# Patient Record
Sex: Female | Born: 1965 | Race: Black or African American | Hispanic: No | Marital: Married | Smoking: Former smoker
Health system: Southern US, Community
[De-identification: ages and names within clinical notes are randomized; demographics above are authoritative.]

## PROBLEM LIST (undated history)

## (undated) DIAGNOSIS — E669 Obesity, unspecified: Secondary | ICD-10-CM

## (undated) DIAGNOSIS — K621 Rectal polyp: Secondary | ICD-10-CM

## (undated) DIAGNOSIS — R7303 Prediabetes: Secondary | ICD-10-CM

## (undated) DIAGNOSIS — D125 Benign neoplasm of sigmoid colon: Secondary | ICD-10-CM

## (undated) DIAGNOSIS — D124 Benign neoplasm of descending colon: Secondary | ICD-10-CM

## (undated) DIAGNOSIS — D649 Anemia, unspecified: Secondary | ICD-10-CM

## (undated) HISTORY — DX: Benign neoplasm of descending colon: D12.4

## (undated) HISTORY — PX: NO PAST SURGERIES: SHX2092

## (undated) HISTORY — DX: Benign neoplasm of sigmoid colon: D12.5

## (undated) HISTORY — DX: Rectal polyp: K62.1

---

## 2004-09-19 ENCOUNTER — Emergency Department (HOSPITAL_COMMUNITY): Admission: EM | Admit: 2004-09-19 | Discharge: 2004-09-19 | Payer: Self-pay | Admitting: *Deleted

## 2011-03-26 ENCOUNTER — Ambulatory Visit: Payer: Self-pay | Admitting: Internal Medicine

## 2011-08-13 ENCOUNTER — Ambulatory Visit: Payer: Self-pay | Admitting: Family Medicine

## 2013-07-04 ENCOUNTER — Ambulatory Visit: Payer: Self-pay | Admitting: Adult Health

## 2013-10-01 IMAGING — US TRANSABDOMINAL ULTRASOUND OF PELVIS
1 series · 17 of 25 positions shown · non-contrast
Comparison: none

REASON FOR EXAM: Enlarged  Uterus Eval Fibroids
COMMENTS:

PROCEDURE:     ARMINAS - ARMINAS PELVIS NON-OB  - August 13, 2011  [DATE]
RESULT:     Comparison: None.
TECHNIQUE: Multiple grayscale and color Doppler images were obtained of the
pelvis via transabdominal ultrasound. Endovaginal ultrasound was not
performed.

[Series 1: transabdominal ultrasound of pelvis · 17 of 64 slices shown]
[im 1/64]
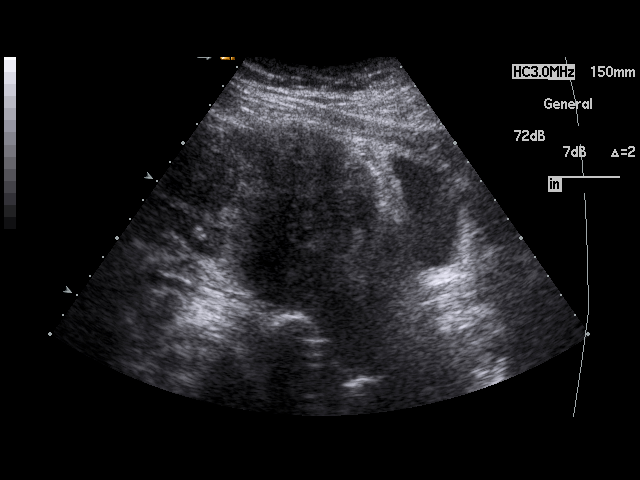
[im 6/64]
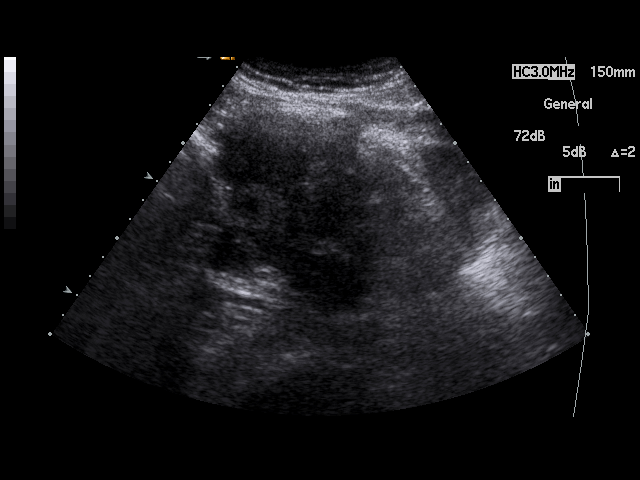
[im 8/64]
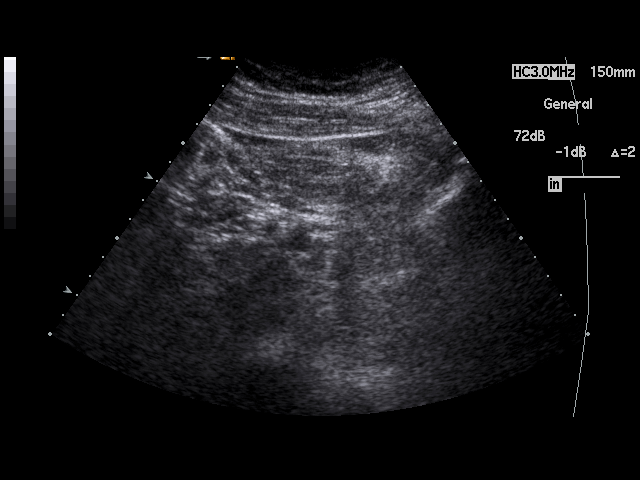
[im 14/64]
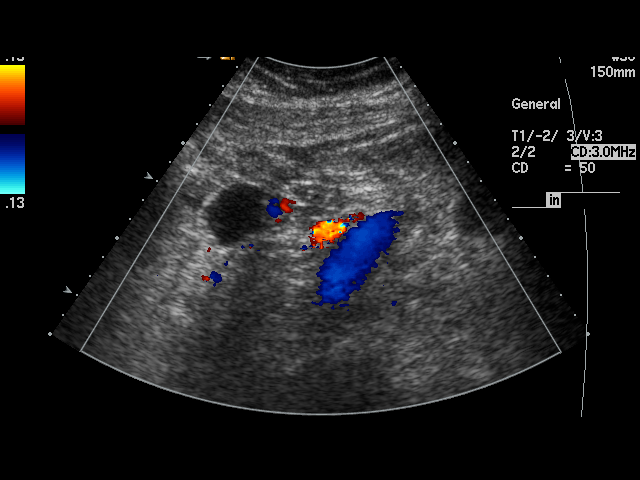
[im 16/64]
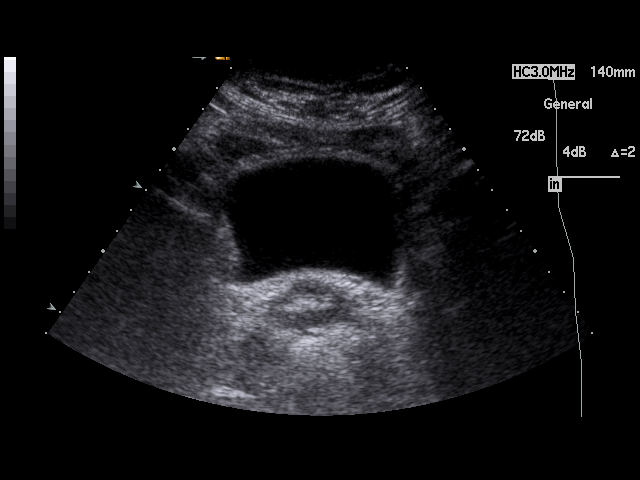
[im 22/64]
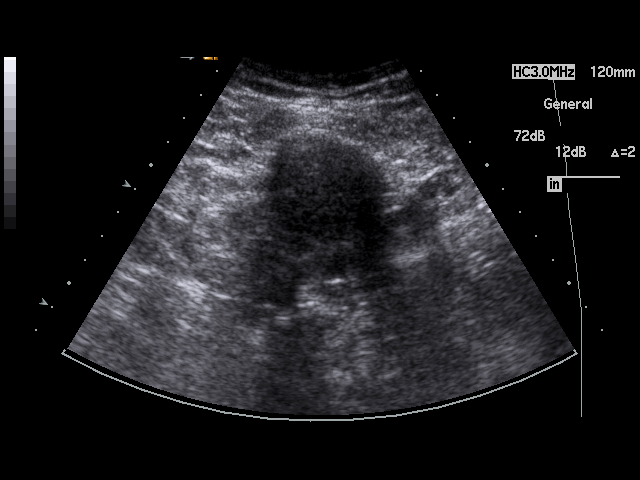
[im 24/64]
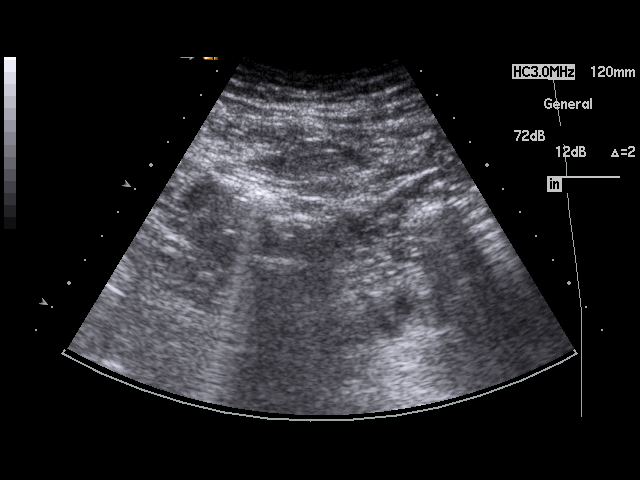
[im 29/64]
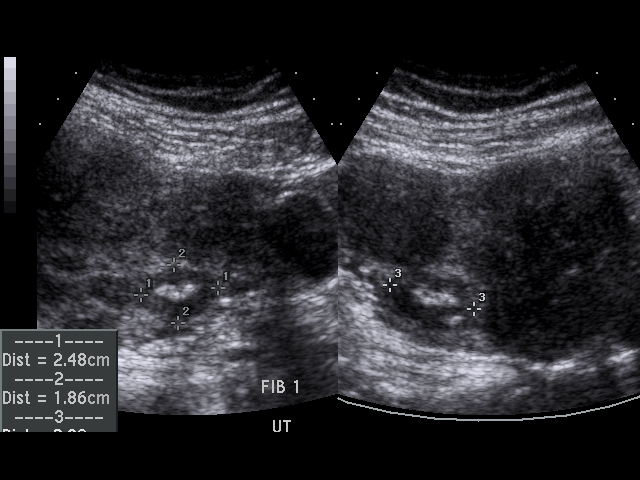
[im 32/64]
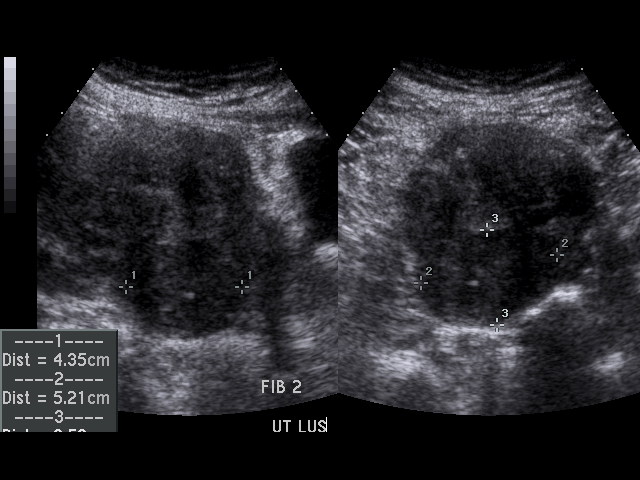
[im 35/64]
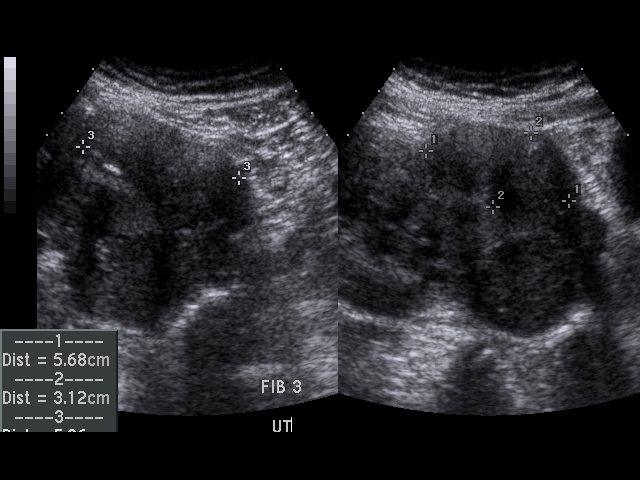
[im 40/64]
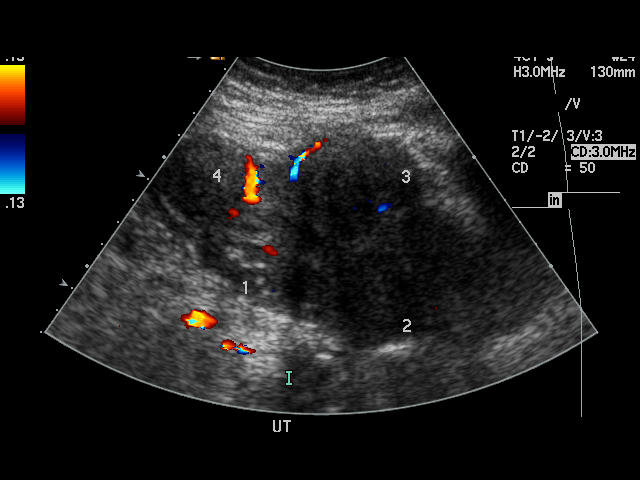
[im 43/64]
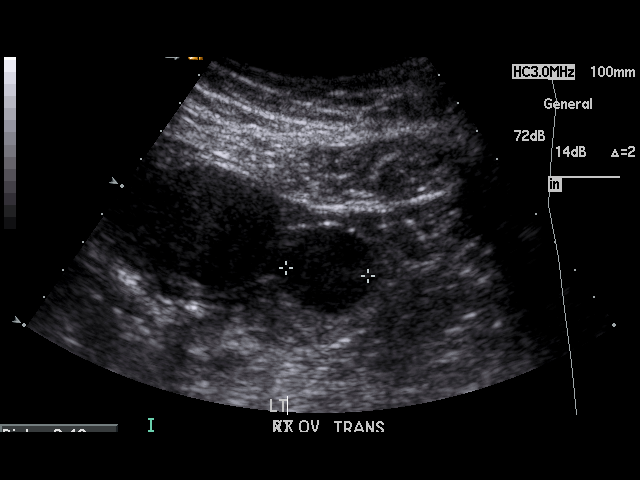
[im 48/64]
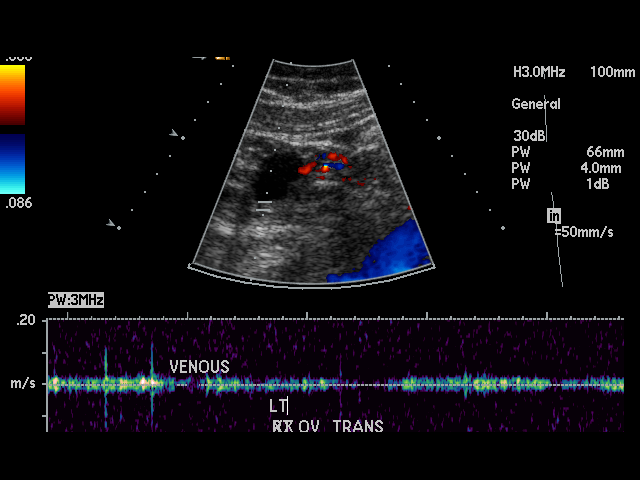
[im 50/64]
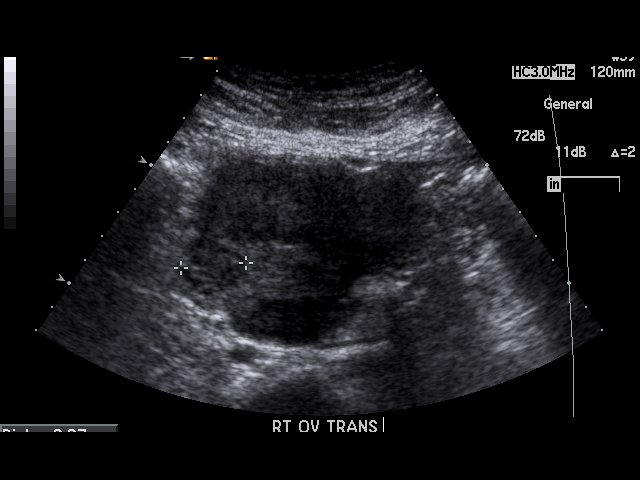
[im 56/64]
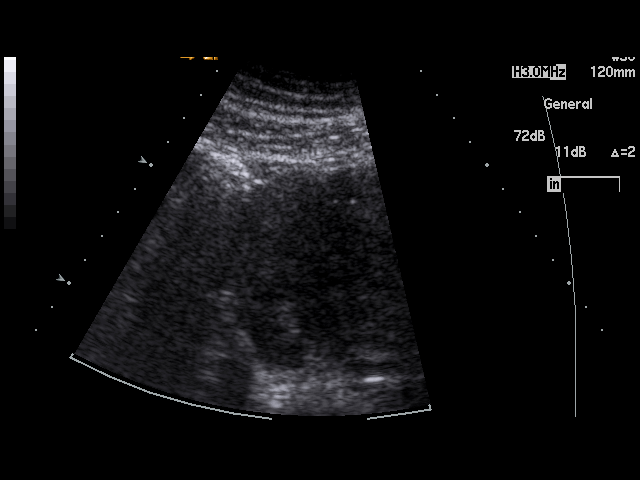
[im 58/64]
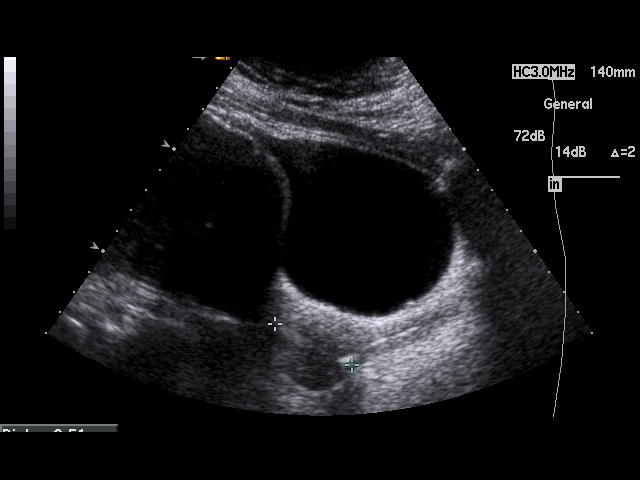
[im 64/64]
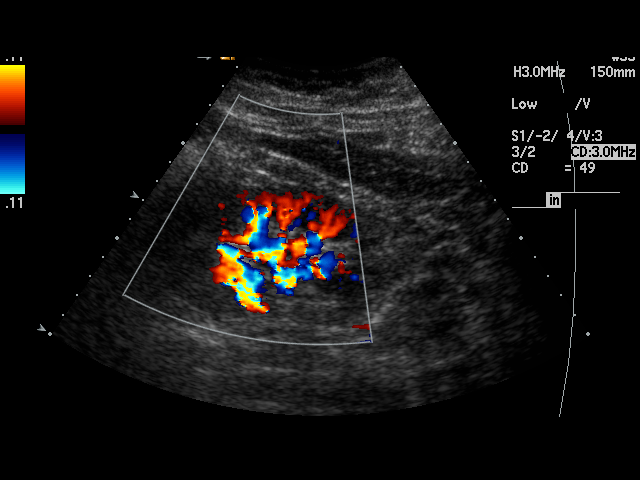

[17 of 25 positions shown; findings below may reference images not displayed]

FINDINGS: The uterus is enlarged, measuring 16.1 x 8.1 x 10.4 cm. There are
innumerable hypoechoic masses in the uterus, consistent with fibroids. The
largest is seen along the anterior left side of the uterus, measuring 6.0 x
5.7 x 3.1 cm. What is felt to represent the endometrial stripe measures 3 mm.

The right ovary measures 3.2 x 1.7 x 2.3 cm. The left ovary measures 3.5 x
2.6 x 2.7 cm. There is a cyst or dominant follicle in the left ovary. It
measures 2.6 cm in greatest dimension. No adnexal mass identified.

Limited images of the kidneys showed no hydronephrosis.
IMPRESSION: Enlarged, fibroid uterus.

## 2014-05-27 ENCOUNTER — Ambulatory Visit: Payer: Self-pay | Admitting: Family Medicine

## 2014-08-01 LAB — HM PAP SMEAR: HM PAP: NEGATIVE

## 2015-04-16 DIAGNOSIS — R748 Abnormal levels of other serum enzymes: Secondary | ICD-10-CM | POA: Insufficient documentation

## 2015-04-16 DIAGNOSIS — N951 Menopausal and female climacteric states: Secondary | ICD-10-CM | POA: Insufficient documentation

## 2015-04-16 DIAGNOSIS — R7303 Prediabetes: Secondary | ICD-10-CM

## 2015-04-16 DIAGNOSIS — E559 Vitamin D deficiency, unspecified: Secondary | ICD-10-CM

## 2015-04-16 DIAGNOSIS — D219 Benign neoplasm of connective and other soft tissue, unspecified: Secondary | ICD-10-CM | POA: Insufficient documentation

## 2015-04-16 DIAGNOSIS — E7800 Pure hypercholesterolemia, unspecified: Secondary | ICD-10-CM | POA: Insufficient documentation

## 2015-04-16 HISTORY — DX: Abnormal levels of other serum enzymes: R74.8

## 2015-04-16 HISTORY — DX: Vitamin D deficiency, unspecified: E55.9

## 2015-04-16 HISTORY — DX: Hypercalcemia: E83.52

## 2015-04-16 HISTORY — DX: Menopausal and female climacteric states: N95.1

## 2015-04-16 HISTORY — DX: Benign neoplasm of connective and other soft tissue, unspecified: D21.9

## 2015-04-16 HISTORY — DX: Prediabetes: R73.03

## 2015-06-27 ENCOUNTER — Encounter: Payer: Self-pay | Admitting: Physician Assistant

## 2015-06-27 ENCOUNTER — Ambulatory Visit (INDEPENDENT_AMBULATORY_CARE_PROVIDER_SITE_OTHER): Payer: BLUE CROSS/BLUE SHIELD | Admitting: Physician Assistant

## 2015-06-27 VITALS — BP 104/80 | HR 89 | Temp 97.5°F | Resp 16 | Wt 207.6 lb

## 2015-06-27 DIAGNOSIS — R7303 Prediabetes: Secondary | ICD-10-CM

## 2015-06-27 DIAGNOSIS — Z1211 Encounter for screening for malignant neoplasm of colon: Secondary | ICD-10-CM

## 2015-06-27 DIAGNOSIS — E7800 Pure hypercholesterolemia, unspecified: Secondary | ICD-10-CM

## 2015-06-27 DIAGNOSIS — R5383 Other fatigue: Secondary | ICD-10-CM | POA: Diagnosis not present

## 2015-06-27 DIAGNOSIS — R748 Abnormal levels of other serum enzymes: Secondary | ICD-10-CM

## 2015-06-27 DIAGNOSIS — R7309 Other abnormal glucose: Secondary | ICD-10-CM

## 2015-06-27 DIAGNOSIS — E559 Vitamin D deficiency, unspecified: Secondary | ICD-10-CM | POA: Diagnosis not present

## 2015-06-27 NOTE — Progress Notes (Signed)
Patient ID: Annette Bradford, female   DOB: 1966/06/07, 49 y.o.   MRN: [id number]  Name: TABATHIA KNOCHE   MRN: [id number]    DOB: 1966-10-09   Date:06/27/2015       Progress Note  Subjective  Chief Complaint  Chief Complaint  Patient presents with  . Follow-up    patient requesting labs and referral for a colonoscopy    HPI  Magaby Rumberger is a 49 year old female that returns to the office today for follow-up on her elevated hemoglobin A1c, high cholesterol, abnormal liver enzymes, and vitamin D deficiency. She is also interested in going ahead and getting a referral for her screening colonoscopy since she is now 49 years old. She has no complaints today and states that she feels well. No problem-specific assessment & plan notes found for this encounter.   History reviewed. No pertinent past medical history.  Past Surgical History  Procedure Laterality Date  . No past surgeries      Family History  Problem Relation Age of Onset  . Diabetes Mother   . Heart attack Father     Social History   Social History  . Marital Status: Married    Spouse Name: N/A  . Number of Children: N/A  . Years of Education: N/A   Occupational History  . Not on file.   Social History Main Topics  . Smoking status: Former Research scientist (life sciences)  . Smokeless tobacco: Not on file  . Alcohol Use: 0.0 oz/week    0 Standard drinks or equivalent per week     Comment: OCCASIONALLY 1-2 TIMES A MONTH  . Drug Use: No  . Sexual Activity: Not on file   Other Topics Concern  . Not on file   Social History Narrative     Current outpatient prescriptions:  Marland Kitchen  Glucosamine-Chondroitin (OSTEO BI-FLEX REGULAR STRENGTH PO), Take by mouth., Disp: , Rfl:  .  MULTIPLE VITAMINS-MINERALS PO, Take 1 tablet by mouth daily., Disp: , Rfl:  .  OMEGA-3 FATTY ACIDS PO, Take 2 capsules by mouth daily., Disp: , Rfl:  .  Psyllium (METAMUCIL MULTIHEALTH FIBER) 55.46 % POWD, Take by mouth., Disp: , Rfl:   No Known  Allergies   Review of Systems  Constitutional: Negative.   HENT: Negative.   Eyes: Negative.   Respiratory: Negative.   Cardiovascular: Negative.   Gastrointestinal: Negative.   Genitourinary: Negative.   Musculoskeletal: Negative.   Skin: Negative.   Neurological: Negative.   Endo/Heme/Allergies: Negative.   Psychiatric/Behavioral: Negative.     Objective  Filed Vitals:   06/27/15 0937  BP: 104/80  Pulse: 89  Temp: 97.5 F (36.4 C)  TempSrc: Oral  Resp: 16  Weight: 207 lb 9.6 oz (94.167 kg)  SpO2: 98%    Physical Exam  Constitutional: She is oriented to person, place, and time and well-developed, well-nourished, and in no distress. No distress.  HENT:  Head: Normocephalic and atraumatic.  Right Ear: Hearing, tympanic membrane, external ear and ear canal normal.  Left Ear: Hearing, tympanic membrane, external ear and ear canal normal.  Nose: Nose normal.  Mouth/Throat: Uvula is midline, oropharynx is clear and moist and mucous membranes are normal. No oropharyngeal exudate.  Eyes: Conjunctivae and EOM are normal. Pupils are equal, round, and reactive to light. Right eye exhibits no discharge. Left eye exhibits no discharge. No scleral icterus.  Neck: Normal range of motion. Neck supple. No tracheal deviation present. No thyromegaly present.  Cardiovascular: Normal rate, regular rhythm and normal heart sounds.  Exam reveals no gallop and no friction rub.   No murmur heard. Pulmonary/Chest: Effort normal and breath sounds normal. No respiratory distress. She has no wheezes. She has no rales.  Abdominal: Soft. Bowel sounds are normal. She exhibits no distension and no mass. There is no tenderness. There is no rebound and no guarding.  Lymphadenopathy:    She has no cervical adenopathy.  Neurological: She is alert and oriented to person, place, and time. Gait normal.  Skin: Skin is warm and dry. She is not diaphoretic.  Psychiatric: Mood, memory, affect and judgment  normal.  Vitals reviewed.  No results found for this or any previous visit (from the past 2160 hour(s)).   Assessment & Plan  Problem List Items Addressed This Visit      Endocrine   Borderline diabetes - Primary   Relevant Orders   HgB A1c     Other   Abnormal liver enzymes   Relevant Orders   Comprehensive Metabolic Panel (CMET)   Hypercholesteremia   Relevant Orders   Lipid panel   Avitaminosis D   Relevant Orders   Vitamin D (25 hydroxy)   Calcium blood increased   Relevant Orders   Comprehensive Metabolic Panel (CMET)    Other Visit Diagnoses    Colon cancer screening        Relevant Orders    CBC with Differential    Ambulatory referral to Gastroenterology    Other fatigue        Relevant Orders    TSH      I will check labs and follow-up pending labs. I also placed a referral to GI for her screening colonoscopy. Follow-up will be set per the lab results. She is going to have her labs drawn at work since they can be done for free and these results will be faxed to [place] once they are received.  Meds ordered this encounter  Medications  . Glucosamine-Chondroitin (OSTEO BI-FLEX REGULAR STRENGTH PO)    Sig: Take by mouth.

## 2015-07-01 ENCOUNTER — Telehealth: Payer: Self-pay | Admitting: Gastroenterology

## 2015-07-01 ENCOUNTER — Other Ambulatory Visit: Payer: Self-pay

## 2015-07-01 NOTE — Telephone Encounter (Signed)
Gastroenterology Pre-Procedure Review  Request Date: 08-15-2015 Requesting Physician: Dr.   PATIENT REVIEW QUESTIONS: The patient responded to the following health history questions as indicated:    1. Are you having any GI issues? no 2. Do you have a personal history of Polyps? no 3. Do you have a family history of Colon Cancer or Polyps? no 4. Diabetes Mellitus? no 5. Joint replacements in the past 12 months?no 6. Major health problems in the past 3 months?no 7. Any artificial heart valves, MVP, or defibrillator?no    MEDICATIONS & ALLERGIES:    Patient reports the following regarding taking any anticoagulation/antiplatelet therapy:   Plavix, Coumadin, Eliquis, Xarelto, Lovenox, Pradaxa, Brilinta, or Effient? no Aspirin? no  Patient confirms/reports the following medications:  Current Outpatient Prescriptions  Medication Sig Dispense Refill   Glucosamine-Chondroitin (OSTEO BI-FLEX REGULAR STRENGTH PO) Take by mouth.     MULTIPLE VITAMINS-MINERALS PO Take 1 tablet by mouth daily.     OMEGA-3 FATTY ACIDS PO Take 2 capsules by mouth daily.     Psyllium (METAMUCIL MULTIHEALTH FIBER) 55.46 % POWD Take by mouth.     No current facility-administered medications for this visit.    Patient confirms/reports the following allergies:  No Known Allergies  No orders of the defined types were placed in this encounter.    AUTHORIZATION INFORMATION Primary Insurance: 1D#: Group #:  Secondary Insurance: 1D#: Group #:  SCHEDULE INFORMATION: Date: 08-15-2015 Time: Location:[facility]

## 2015-07-01 NOTE — Telephone Encounter (Signed)
Orders sent to [facility] for 08-15-15.

## 2015-07-18 ENCOUNTER — Encounter: Payer: Self-pay | Admitting: Physician Assistant

## 2015-07-25 ENCOUNTER — Telehealth: Payer: Self-pay | Admitting: Physician Assistant

## 2015-07-25 NOTE — Telephone Encounter (Signed)
Patient advised as directed below. Will call back to schedule appointment in 6 months.  Thanks,  -Joseline

## 2015-07-25 NOTE — Telephone Encounter (Signed)
Please inform patient I received her lab results.  Blood count and kidney function were normal.  TSH and Vit D were normal also.  There was a slight increase in liver function measurements.  Cholesterol was also elevated as was hemoglobin A1c.  Try to adhere to a low fat, low cholesterol, low sugar diet.  Also try to add exercise for 30-40 minutes 3 times per week.  We will recheck LFTs, cholesterol and HgBA1c in 6 months.  Thanks!

## 2015-11-28 LAB — HM MAMMOGRAPHY

## 2015-12-12 ENCOUNTER — Encounter: Payer: Self-pay | Admitting: Physician Assistant

## 2015-12-16 ENCOUNTER — Encounter: Payer: Self-pay | Admitting: *Deleted

## 2015-12-25 NOTE — Discharge Instructions (Signed)

## 2015-12-26 ENCOUNTER — Encounter
Admission: RE | Disposition: A | Discharge status: Home / Self Care | Payer: Self-pay | Source: Ambulatory Visit | Attending: Gastroenterology

## 2015-12-26 ENCOUNTER — Ambulatory Visit: Payer: BLUE CROSS/BLUE SHIELD | Admitting: Anesthesiology

## 2015-12-26 ENCOUNTER — Ambulatory Visit
Admission: RE | Admit: 2015-12-26 | Discharge: 2015-12-26 | Disposition: A | Discharge status: Home / Self Care | Payer: BLUE CROSS/BLUE SHIELD | Source: Ambulatory Visit | Attending: Gastroenterology | Admitting: Gastroenterology

## 2015-12-26 DIAGNOSIS — K641 Second degree hemorrhoids: Secondary | ICD-10-CM | POA: Diagnosis not present

## 2015-12-26 DIAGNOSIS — Z1211 Encounter for screening for malignant neoplasm of colon: Secondary | ICD-10-CM | POA: Diagnosis present

## 2015-12-26 DIAGNOSIS — D124 Benign neoplasm of descending colon: Secondary | ICD-10-CM | POA: Insufficient documentation

## 2015-12-26 DIAGNOSIS — D125 Benign neoplasm of sigmoid colon: Secondary | ICD-10-CM | POA: Insufficient documentation

## 2015-12-26 DIAGNOSIS — K621 Rectal polyp: Secondary | ICD-10-CM | POA: Insufficient documentation

## 2015-12-26 DIAGNOSIS — Z79899 Other long term (current) drug therapy: Secondary | ICD-10-CM | POA: Diagnosis not present

## 2015-12-26 DIAGNOSIS — E7800 Pure hypercholesterolemia, unspecified: Secondary | ICD-10-CM | POA: Diagnosis not present

## 2015-12-26 DIAGNOSIS — Z87891 Personal history of nicotine dependence: Secondary | ICD-10-CM | POA: Diagnosis not present

## 2015-12-26 HISTORY — PX: POLYPECTOMY: SHX5525

## 2015-12-26 HISTORY — PX: COLONOSCOPY WITH PROPOFOL: SHX5780

## 2015-12-26 SURGERY — COLONOSCOPY WITH PROPOFOL
Anesthesia: Monitor Anesthesia Care

## 2015-12-26 MED ORDER — SODIUM CHLORIDE 0.9 % IV SOLN: INTRAVENOUS | Status: DC

## 2015-12-26 MED ORDER — STERILE WATER FOR IRRIGATION IR SOLN
Status: DC | PRN
  Administered 2015-12-26: 08:00:00

## 2015-12-26 MED ORDER — PROPOFOL 10 MG/ML IV BOLUS
INTRAVENOUS | Status: DC | PRN
  Administered 2015-12-26: 150 mg via INTRAVENOUS
  Administered 2015-12-26 (×2): 50 mg via INTRAVENOUS

## 2015-12-26 MED ORDER — LACTATED RINGERS IV SOLN
INTRAVENOUS | Status: DC
  Administered 2015-12-26: 07:00:00 via INTRAVENOUS

## 2015-12-26 MED ORDER — FENTANYL CITRATE (PF) 100 MCG/2ML IJ SOLN: 25.0000 ug | INTRAMUSCULAR | Status: DC | PRN

## 2015-12-26 MED ORDER — LIDOCAINE HCL (CARDIAC) 20 MG/ML IV SOLN
INTRAVENOUS | Status: DC | PRN
  Administered 2015-12-26: 50 mg via INTRAVENOUS

## 2015-12-26 SURGICAL SUPPLY — 28 items
CANISTER SUCT 1200ML W/VALVE (MISCELLANEOUS) ×4 IMPLANT
FCP ESCP3.2XJMB 240X2.8X (MISCELLANEOUS)
FORCEPS BIOP RAD 4 LRG CAP 4 (CUTTING FORCEPS) IMPLANT
FORCEPS BIOP RJ4 240 W/NDL (MISCELLANEOUS)
FORCEPS ESCP3.2XJMB 240X2.8X (MISCELLANEOUS) IMPLANT
GOWN CVR UNV OPN BCK APRN NK (MISCELLANEOUS) ×4 IMPLANT
GOWN ISOL THUMB LOOP REG UNIV (MISCELLANEOUS) ×8
HEMOCLIP INSTINCT (CLIP) IMPLANT
INJECTOR VARIJECT VIN23 (MISCELLANEOUS) IMPLANT
KIT CO2 TUBING (TUBING) IMPLANT
KIT DEFENDO VALVE AND CONN (KITS) IMPLANT
KIT ENDO PROCEDURE OLY (KITS) ×4 IMPLANT
LIGATOR MULTIBAND 6SHOOTER MBL (MISCELLANEOUS) IMPLANT
MARKER SPOT ENDO TATTOO 5ML (MISCELLANEOUS) IMPLANT
PAD GROUND ADULT SPLIT (MISCELLANEOUS) IMPLANT
SNARE SHORT THROW 13M SML OVAL (MISCELLANEOUS) ×2 IMPLANT
SNARE SHORT THROW 30M LRG OVAL (MISCELLANEOUS) IMPLANT
SPOT EX ENDOSCOPIC TATTOO (MISCELLANEOUS)
SUCTION POLY TRAP 4CHAMBER (MISCELLANEOUS) IMPLANT
TRAP SUCTION POLY (MISCELLANEOUS) IMPLANT
TUBING CONN 6MMX3.1M (TUBING)
TUBING SUCTION CONN 0.25 STRL (TUBING) IMPLANT
UNDERPAD 30X60 958B10 (PK) (MISCELLANEOUS) IMPLANT
VALVE BIOPSY ENDO (VALVE) IMPLANT
VARIJECT INJECTOR VIN23 (MISCELLANEOUS)
WATER AUXILLARY (MISCELLANEOUS) IMPLANT
WATER STERILE IRR 250ML POUR (IV SOLUTION) ×4 IMPLANT
WATER STERILE IRR 500ML POUR (IV SOLUTION) IMPLANT

## 2015-12-26 NOTE — Anesthesia Postprocedure Evaluation (Signed)
Anesthesia Post Note  Patient: Annette Bradford  Procedure(s) Performed: Procedure(s) (LRB): COLONOSCOPY WITH PROPOFOL (N/A) POLYPECTOMY  Patient location during evaluation: PACU Anesthesia Type: MAC Level of consciousness: awake and alert Pain management: pain level controlled Vital Signs Assessment: post-procedure vital signs reviewed and stable Respiratory status: spontaneous breathing, nonlabored ventilation, respiratory function stable and patient connected to nasal cannula oxygen Cardiovascular status: blood pressure returned to baseline and stable Postop Assessment: no signs of nausea or vomiting Anesthetic complications: no    Saadia Dewitt C

## 2015-12-26 NOTE — Anesthesia Postprocedure Evaluation (Deleted)
Anesthesia Post Note  Patient: Annette Bradford  Procedure(s) Performed: Procedure(s) (LRB): COLONOSCOPY WITH PROPOFOL (N/A) POLYPECTOMY  Patient location during evaluation: PACU Anesthesia Type: General Level of consciousness: awake and alert Pain management: pain level controlled Vital Signs Assessment: post-procedure vital signs reviewed and stable Respiratory status: spontaneous breathing, nonlabored ventilation, respiratory function stable and patient connected to nasal cannula oxygen Cardiovascular status: blood pressure returned to baseline and stable Postop Assessment: no signs of nausea or vomiting Anesthetic complications: no    Dev Dhondt C

## 2015-12-26 NOTE — Anesthesia Procedure Notes (Signed)
Procedure Name: MAC Date/Time: 12/26/2015 7:46 AM Performed by: Cameron Ali Pre-anesthesia Checklist: Patient identified, Emergency Drugs available, Suction available, Timeout performed and Patient being monitored Patient Re-evaluated:Patient Re-evaluated prior to inductionOxygen Delivery Method: Nasal cannula Placement Confirmation: positive ETCO2

## 2015-12-26 NOTE — Addendum Note (Signed)
Addendum  created 12/26/15 1151 by Shyrl Numbers, MD   Modules edited: Clinical Notes, Notes Section   Clinical Notes:  File: [id number]   Notes Section:  Delete: [id number]

## 2015-12-26 NOTE — H&P (Signed)
  [facility]  [address], [place], [place] [address] Phone: [phone] Fax : [phone]  Primary Care Physician:  Mar Daring, PA-C Primary Gastroenterologist:  Dr. Allen Norris  Pre-Procedure History & Physical: HPI:  Annette Bradford is a 50 y.o. female is here for a screening colonoscopy.   History reviewed. No pertinent past medical history.  Past Surgical History  Procedure Laterality Date  . No past surgeries      Prior to Admission medications   Medication Sig Start Date End Date Taking? Authorizing Provider  Glucosamine-Chondroitin (OSTEO BI-FLEX REGULAR STRENGTH PO) Take by mouth.   Yes Historical Provider, MD  MULTIPLE VITAMINS-MINERALS PO Take 1 tablet by mouth daily.   Yes Historical Provider, MD  OMEGA-3 FATTY ACIDS PO Take 2 capsules by mouth daily. 08/10/13  Yes Historical Provider, MD  Psyllium (METAMUCIL MULTIHEALTH FIBER) 55.46 % POWD Take by mouth.    Yes Historical Provider, MD    Allergies as of 07/01/2015  . (No Known Allergies)    Family History  Problem Relation Age of Onset  . Diabetes Mother   . Heart attack Father     Social History   Social History  . Marital Status: Married    Spouse Name: N/A  . Number of Children: N/A  . Years of Education: N/A   Occupational History  . Not on file.   Social History Main Topics  . Smoking status: Former Research scientist (life sciences)  . Smokeless tobacco: Not on file  . Alcohol Use: 0.0 oz/week    0 Standard drinks or equivalent per week     Comment: OCCASIONALLY 1-2 TIMES A MONTH  . Drug Use: No  . Sexual Activity: Not on file   Other Topics Concern  . Not on file   Social History Narrative    Review of Systems: See HPI, otherwise negative ROS  Physical Exam: BP 105/78 mmHg  Pulse 88  Temp(Src) 97.9 F (36.6 C) (Temporal)  Resp 18  Ht 5\' 2"  (1.575 m)  Wt 206 lb (93.441 kg)  BMI 37.67 kg/m2  SpO2 97% General:   Alert,  pleasant and cooperative in NAD Head:  Normocephalic  and atraumatic. Neck:  Supple; no masses or thyromegaly. Lungs:  Clear throughout to auscultation.    Heart:  Regular rate and rhythm. Abdomen:  Soft, nontender and nondistended. Normal bowel sounds, without guarding, and without rebound.   Neurologic:  Alert and  oriented x4;  grossly normal neurologically.  Impression/Plan: Annette Bradford is now here to undergo a screening colonoscopy.  Risks, benefits, and alternatives regarding colonoscopy have been reviewed with the patient.  Questions have been answered.  All parties agreeable.

## 2015-12-26 NOTE — Transfer of Care (Signed)
Immediate Anesthesia Transfer of Care Note  Patient: Annette Bradford  Procedure(s) Performed: Procedure(s): COLONOSCOPY WITH PROPOFOL (N/A) POLYPECTOMY  Patient Location: PACU  Anesthesia Type: MAC  Level of Consciousness: awake, alert  and patient cooperative  Airway and Oxygen Therapy: Patient Spontanous Breathing and Patient connected to supplemental oxygen  Post-op Assessment: Post-op Vital signs reviewed, Patient's Cardiovascular Status Stable, Respiratory Function Stable, Patent Airway and No signs of Nausea or vomiting  Post-op Vital Signs: Reviewed and stable  Complications: No apparent anesthesia complications

## 2015-12-26 NOTE — Op Note (Signed)
[facility] Patient Name: Annette Bradford Procedure Date: 12/26/2015 7:48 AM MRN: [id number] Account #: [id number] Date of Birth: [birth year] Admit Type: Outpatient Age: 50 Room: [facility] OR ROOM 01 Gender: Female Note Status: Finalized Procedure:            Colonoscopy Indications:          Screening for colorectal malignant neoplasm Providers:            Lucilla Lame, MD Referring MD:         Stephens Shire (Referring MD) Medicines:            Propofol per Anesthesia Complications:        No immediate complications. Procedure:            Pre-Anesthesia Assessment:                       - Prior to the procedure, a History and Physical was                        performed, and patient medications and allergies were                        reviewed. The patient's tolerance of previous                        anesthesia was also reviewed. The risks and benefits of                        the procedure and the sedation options and risks were                        discussed with the patient. All questions were                        answered, and informed consent was obtained. Prior                        Anticoagulants: The patient has taken no previous                        anticoagulant or antiplatelet agents. ASA Grade                        Assessment: II - A patient with mild systemic disease.                        After reviewing the risks and benefits, the patient was                        deemed in satisfactory condition to undergo the                        procedure.                       After obtaining informed consent, the colonoscope was                        passed under direct vision. Throughout the procedure,  the patient's blood pressure, pulse, and oxygen                        saturations were monitored continuously. The Olympus CF                        H180AL colonoscope (S#: [id number]) was introduced through                         the anus and advanced to the the cecum, identified by                        appendiceal orifice and ileocecal valve. The                        colonoscopy was performed without difficulty. The                        patient tolerated the procedure well. The quality of                        the bowel preparation was excellent. Findings:      The perianal and digital rectal examinations were normal.      A 4 mm polyp was found in the descending colon. The polyp was sessile.       The polyp was removed with a cold biopsy forceps. Resection and       retrieval were complete.      A 8 mm polyp was found in the sigmoid colon. The polyp was sessile. The       polyp was removed with a cold snare. Resection and retrieval were       complete.      A 5 mm polyp was found in the rectum. The polyp was sessile. The polyp       was removed with a cold snare. Resection and retrieval were complete.      Non-bleeding internal hemorrhoids were found during retroflexion. The       hemorrhoids were Grade II (internal hemorrhoids that prolapse but reduce       spontaneously). Impression:           - One 4 mm polyp in the descending colon, removed with                        a cold biopsy forceps. Resected and retrieved.                       - One 8 mm polyp in the sigmoid colon, removed with a                        cold snare. Resected and retrieved.                       - One 5 mm polyp in the rectum, removed with a cold                        snare. Resected and retrieved.                       - Non-bleeding internal hemorrhoids. Recommendation:       -  Await pathology results.                       - Repeat colonoscopy in 5 years if polyp adenoma and 10                        years if hyperplastic Procedure Code(s):    --- Professional ---                       [phone], Colonoscopy, flexible; with removal of tumor(s),                        polyp(s), or other lesion(s) by  snare technique                       45380, 48, Colonoscopy, flexible; with biopsy, single                        or multiple Diagnosis Code(s):    --- Professional ---                       Z12.11, Encounter for screening for malignant neoplasm                        of colon                       D12.4, Benign neoplasm of descending colon                       D12.5, Benign neoplasm of sigmoid colon                       K62.1, Rectal polyp CPT copyright 2016 American Medical Association. All rights reserved. The codes documented in this report are preliminary and upon coder review may  be revised to meet current compliance requirements. Lucilla Lame, MD 12/26/2015 8:07:15 AM This report has been signed electronically. Number of Addenda: 0 Note Initiated On: 12/26/2015 7:48 AM Scope Withdrawal Time: 0 hours 8 minutes 32 seconds  Total Procedure Duration: 0 hours 11 minutes 49 seconds       [facility]

## 2015-12-26 NOTE — Anesthesia Preprocedure Evaluation (Signed)
Anesthesia Evaluation  Patient identified by MRN, date of birth, ID band Patient awake    Reviewed: Allergy & Precautions, NPO status , Patient's Chart, lab work & pertinent test results  Airway Mallampati: II  TM Distance: >3 FB Neck ROM: Full    Dental no notable dental hx.    Pulmonary neg pulmonary ROS, former smoker,    Pulmonary exam normal breath sounds clear to auscultation       Cardiovascular negative cardio ROS Normal cardiovascular exam Rhythm:Regular Rate:Normal  hypercholesterol   Neuro/Psych negative neurological ROS  negative psych ROS   GI/Hepatic negative GI ROS, Neg liver ROS,   Endo/Other  negative endocrine ROSBorderline diabetes  Renal/GU negative Renal ROS  negative genitourinary   Musculoskeletal negative musculoskeletal ROS (+)   Abdominal   Peds negative pediatric ROS (+)  Hematology negative hematology ROS (+)   Anesthesia Other Findings   Reproductive/Obstetrics negative OB ROS                             Anesthesia Physical Anesthesia Plan  ASA: II  Anesthesia Plan: MAC   Post-op Pain Management:    Induction: Intravenous  Airway Management Planned: Natural Airway  Additional Equipment:   Intra-op Plan:   Post-operative Plan: Extubation in OR  Informed Consent: I have reviewed the patients History and Physical, chart, labs and discussed the procedure including the risks, benefits and alternatives for the proposed anesthesia with the patient or authorized representative who has indicated his/her understanding and acceptance.   Dental advisory given  Plan Discussed with: CRNA  Anesthesia Plan Comments:         Anesthesia Quick Evaluation

## 2015-12-29 ENCOUNTER — Encounter: Payer: Self-pay | Admitting: Gastroenterology

## 2015-12-31 ENCOUNTER — Encounter: Payer: Self-pay | Admitting: Gastroenterology

## 2016-04-23 ENCOUNTER — Encounter: Payer: Self-pay | Admitting: Physician Assistant

## 2016-04-23 ENCOUNTER — Ambulatory Visit (INDEPENDENT_AMBULATORY_CARE_PROVIDER_SITE_OTHER): Payer: BLUE CROSS/BLUE SHIELD | Admitting: Physician Assistant

## 2016-04-23 VITALS — BP 110/78 | HR 89 | Temp 97.1°F | Resp 16 | Wt 211.2 lb

## 2016-04-23 DIAGNOSIS — E782 Mixed hyperlipidemia: Secondary | ICD-10-CM

## 2016-04-23 DIAGNOSIS — R7303 Prediabetes: Secondary | ICD-10-CM

## 2016-04-23 DIAGNOSIS — M62830 Muscle spasm of back: Secondary | ICD-10-CM

## 2016-04-23 DIAGNOSIS — R748 Abnormal levels of other serum enzymes: Secondary | ICD-10-CM | POA: Diagnosis not present

## 2016-04-23 HISTORY — DX: Mixed hyperlipidemia: E78.2

## 2016-04-23 MED ORDER — METAXALONE 400 MG PO TABS: 400.0000 mg | ORAL_TABLET | Freq: Three times a day (TID) | ORAL | Status: DC

## 2016-04-23 NOTE — Patient Instructions (Signed)
Back Exercises The following exercises strengthen the muscles that help to support the back. They also help to keep the lower back flexible. Doing these exercises can help to prevent back pain or lessen existing pain. If you have back pain or discomfort, try doing these exercises 2-3 times each day or as told by your health care provider. When the pain goes away, do them once each day, but increase the number of times that you repeat the steps for each exercise (do more repetitions). If you do not have back pain or discomfort, do these exercises once each day or as told by your health care provider. EXERCISES Single Knee to Chest Repeat these steps 3-5 times for each leg: 1. Lie on your back on a firm bed or the floor with your legs extended. 2. Bring one knee to your chest. Your other leg should stay extended and in contact with the floor. 3. Hold your knee in place by grabbing your knee or thigh. 4. Pull on your knee until you feel a gentle stretch in your lower back. 5. Hold the stretch for 10-30 seconds. 6. Slowly release and straighten your leg. Pelvic Tilt Repeat these steps 5-10 times: 1. Lie on your back on a firm bed or the floor with your legs extended. 2. Bend your knees so they are pointing toward the ceiling and your feet are flat on the floor. 3. Tighten your lower abdominal muscles to press your lower back against the floor. This motion will tilt your pelvis so your tailbone points up toward the ceiling instead of pointing to your feet or the floor. 4. With gentle tension and even breathing, hold this position for 5-10 seconds. Cat-Cow Repeat these steps until your lower back becomes more flexible: 1. Get into a hands-and-knees position on a firm surface. Keep your hands under your shoulders, and keep your knees under your hips. You may place padding under your knees for comfort. 2. Let your head hang down, and point your tailbone toward the floor so your lower back becomes  rounded like the back of a cat. 3. Hold this position for 5 seconds. 4. Slowly lift your head and point your tailbone up toward the ceiling so your back forms a sagging arch like the back of a cow. 5. Hold this position for 5 seconds. Press-Ups Repeat these steps 5-10 times: 1. Lie on your abdomen (face-down) on the floor. 2. Place your palms near your head, about shoulder-width apart. 3. While you keep your back as relaxed as possible and keep your hips on the floor, slowly straighten your arms to raise the top half of your body and lift your shoulders. Do not use your back muscles to raise your upper torso. You may adjust the placement of your hands to make yourself more comfortable. 4. Hold this position for 5 seconds while you keep your back relaxed. 5. Slowly return to lying flat on the floor. Bridges Repeat these steps 10 times: 1. Lie on your back on a firm surface. 2. Bend your knees so they are pointing toward the ceiling and your feet are flat on the floor. 3. Tighten your buttocks muscles and lift your buttocks off of the floor until your waist is at almost the same height as your knees. You should feel the muscles working in your buttocks and the back of your thighs. If you do not feel these muscles, slide your feet 1-2 inches farther away from your buttocks. 4. Hold this position for 3-5   seconds. 5. Slowly lower your hips to the starting position, and allow your buttocks muscles to relax completely. If this exercise is too easy, try doing it with your arms crossed over your chest. Abdominal Crunches Repeat these steps 5-10 times: 1. Lie on your back on a firm bed or the floor with your legs extended. 2. Bend your knees so they are pointing toward the ceiling and your feet are flat on the floor. 3. Cross your arms over your chest. 4. Tip your chin slightly toward your chest without bending your neck. 5. Tighten your abdominal muscles and slowly raise your trunk (torso) high  enough to lift your shoulder blades a tiny bit off of the floor. Avoid raising your torso higher than that, because it can put too much stress on your low back and it does not help to strengthen your abdominal muscles. 6. Slowly return to your starting position. Back Lifts Repeat these steps 5-10 times: 1. Lie on your abdomen (face-down) with your arms at your sides, and rest your forehead on the floor. 2. Tighten the muscles in your legs and your buttocks. 3. Slowly lift your chest off of the floor while you keep your hips pressed to the floor. Keep the back of your head in line with the curve in your back. Your eyes should be looking at the floor. 4. Hold this position for 3-5 seconds. 5. Slowly return to your starting position. SEEK MEDICAL CARE IF:  Your back pain or discomfort gets much worse when you do an exercise.  Your back pain or discomfort does not lessen within 2 hours after you exercise. If you have any of these problems, stop doing these exercises right away. Do not do them again unless your health care provider says that you can. SEEK IMMEDIATE MEDICAL CARE IF:  You develop sudden, severe back pain. If this happens, stop doing the exercises right away. Do not do them again unless your health care provider says that you can.   This information is not intended to replace advice given to you by your health care provider. Make sure you discuss any questions you have with your health care provider.   Document Released: 11/25/2004 Document Revised: 07/09/2015 Document Reviewed: 12/12/2014 Elsevier Interactive Patient Education 2016 Elsevier Inc. Muscle Cramps and Spasms Muscle cramps and spasms occur when a muscle or muscles tighten and you have no control over this tightening (involuntary muscle contraction). They are a common problem and can develop in any muscle. The most common place is in the calf muscles of the leg. Both muscle cramps and muscle spasms are involuntary muscle  contractions, but they also have differences:  7. Muscle cramps are sporadic and painful. They may last a few seconds to a quarter of an hour. Muscle cramps are often more forceful and last longer than muscle spasms. 8. Muscle spasms may or may not be painful. They may also last just a few seconds or much longer. CAUSES  It is uncommon for cramps or spasms to be due to a serious underlying problem. In many cases, the cause of cramps or spasms is unknown. Some common causes are:  5. Overexertion.  6. Overuse from repetitive motions (doing the same thing over and over).  7. Remaining in a certain position for a long period of time.  8. Improper preparation, form, or technique while performing a sport or activity.  9. Dehydration.  10. Injury.  11. Side effects of some medicines.  12. Abnormally low levels of  the salts and ions in your blood (electrolytes), especially potassium and calcium. This could happen if you are taking water pills (diuretics) or you are pregnant.  Some underlying medical problems can make it more likely to develop cramps or spasms. These include, but are not limited to:  6. Diabetes.  7. Parkinson disease.  8. Hormone disorders, such as thyroid problems.  9. Alcohol abuse.  10. Diseases specific to muscles, joints, and bones.  11. Blood vessel disease where not enough blood is getting to the muscles.  HOME CARE INSTRUCTIONS  6. Stay well hydrated. Drink enough water and fluids to keep your urine clear or pale yellow. 7. It may be helpful to massage, stretch, and relax the affected muscle. 8. For tight or tense muscles, use a warm towel, heating pad, or hot shower water directed to the affected area. 9. If you are sore or have pain after a cramp or spasm, applying ice to the affected area may relieve discomfort. 1. Put ice in a plastic bag. 2. Place a towel between your skin and the bag. 3. Leave the ice on for 15-20 minutes, 03-04 times a  day. 10. Medicines used to treat a known cause of cramps or spasms may help reduce their frequency or severity. Only take over-the-counter or prescription medicines as directed by your caregiver. SEEK MEDICAL CARE IF:  Your cramps or spasms get more severe, more frequent, or do not improve over time.  MAKE SURE YOU:  6. Understand these instructions. 7. Will watch your condition. 8. Will get help right away if you are not doing well or get worse.   This information is not intended to replace advice given to you by your health care provider. Make sure you discuss any questions you have with your health care provider.   Document Released: 04/09/2002 Document Revised: 02/12/2013 Document Reviewed: 10/04/2012 Elsevier Interactive Patient Education 2016 Ruth tablets What is this medicine? METAXALONE (me TAX a lone) is a muscle relaxer. It is used to treat pain and stiffness in muscles caused by strains, sprains, or other injury. This medicine may be used for other purposes; ask your health care provider or pharmacist if you have questions. What should I tell my health care provider before I take this medicine? They need to know if you have any of these conditions: -anemia or blood disorder -kidney disease -liver disease -an unusual or allergic reaction to metaxalone, other medicines, foods, dyes, or preservatives -pregnant or trying to get pregnant -breast-feeding How should I use this medicine? Take this medicine by mouth. Swallow it with a full glass of water. Follow the directions on the prescription label. Do not take more medicine than you are told to take. Talk to your pediatrician regarding the use of this medicine in children. While this drug may be prescribed for children as young as 86 years of age for selected conditions, precautions do apply. Overdosage: If you think you have taken too much of this medicine contact a poison control center or emergency room at  once. NOTE: This medicine is only for you. Do not share this medicine with others. What if I miss a dose? If you miss a dose, take it as soon as you can. If it is almost time for your next dose, take only that dose. Do not take double or extra doses. What may interact with this medicine? -alcohol or medicines that contain alcohol -antihistamines -medicines for depression, anxiety, and other mental conditions -medicines for pain like codeine,  oxycodone, tramadol, and propoxyphene -medicines for sleep -phenobarbital This list may not describe all possible interactions. Give your health care provider a list of all the medicines, herbs, non-prescription drugs, or dietary supplements you use. Also tell them if you smoke, drink alcohol, or use illegal drugs. Some items may interact with your medicine. What should I watch for while using this medicine? Check with your doctor or health care professional if your condition does not improve within 1 to 3 weeks. You may get drowsy or dizzy when you first start taking the medicine or change doses. Do not drive, use machinery, or do anything that may be dangerous until you know how the medicine affects you. Stand or sit up slowly. What side effects may I notice from receiving this medicine? Side effects that you should report to your doctor or health care professional as soon as possible: -difficulty breathing -skin rash -unusually weak or tired -vomiting -swelling of face, lips, or tongue -yellow eyes or skin Side effects that usually do not require medical attention (report to your doctor or health care professional if they continue or are bothersome): -headache -irritability -nervousness -stomach upset This list may not describe all possible side effects. Call your doctor for medical advice about side effects. You may report side effects to FDA at 1-800-FDA-1088. Where should I keep my medicine? Keep out of the reach of children. Store at room  temperature between 15 and 30 degrees C (59 and 86 degrees F). Keep container tightly closed. Throw away any unused medicine after the expiration date. NOTE: This sheet is a summary. It may not cover all possible information. If you have questions about this medicine, talk to your doctor, pharmacist, or health care provider.    2016, Elsevier/Gold Standard. (2014-05-02 15:32:46)

## 2016-04-23 NOTE — Progress Notes (Signed)
Patient: Annette Bradford Female    DOB: [birth year]   50 y.o.   MRN: [id number] Visit Date: 04/23/2016  Today's Provider: Mar Daring, PA-C   Chief Complaint  Patient presents with  . Back Pain   Subjective:    Back Pain This is a new problem. The current episode started more than 1 month ago (4 months and is a lot better). The problem has been gradually improving since onset. The pain is present in the thoracic spine and lumbar spine (on the right side). Quality: sore. The pain does not radiate. The pain is mild. The pain is the same all the time. The symptoms are aggravated by twisting. Pertinent negatives include no numbness, tingling or weakness. She has tried heat (massages at the gym) for the symptoms. The treatment provided moderate relief.      No Known Allergies Current Meds  Medication Sig  . aspirin 81 MG tablet Take 81 mg by mouth daily.  . Cholecalciferol (VITAMIN D3) 1000 units CAPS Take by mouth.  . Glucosamine-Chondroitin (OSTEO BI-FLEX REGULAR STRENGTH PO) Take by mouth.  . MULTIPLE VITAMINS-MINERALS PO Take 1 tablet by mouth daily.  . OMEGA-3 FATTY ACIDS PO Take 2 capsules by mouth daily.  . Psyllium (METAMUCIL MULTIHEALTH FIBER) 55.46 % POWD Take by mouth.     Review of Systems  Constitutional: Negative.   Respiratory: Negative.   Cardiovascular: Negative.   Gastrointestinal: Negative.   Genitourinary: Negative.   Musculoskeletal: Positive for back pain. Negative for gait problem.  Neurological: Negative for tingling, weakness and numbness.    Social History  Substance Use Topics  . Smoking status: Former Research scientist (life sciences)  . Smokeless tobacco: Not on file  . Alcohol Use: 0.0 oz/week    0 Standard drinks or equivalent per week     Comment: OCCASIONALLY 1-2 TIMES A MONTH   Objective:   BP 110/78 mmHg  Pulse 89  Temp(Src) 97.1 F (36.2 C) (Oral)  Resp 16  Wt 211 lb 3.2 oz (95.8 kg)  Physical Exam  Constitutional: She appears  well-developed and well-nourished. No distress.  Neck: Normal range of motion. Neck supple.  Cardiovascular: Normal rate, regular rhythm and normal heart sounds.  Exam reveals no gallop and no friction rub.   No murmur heard. Pulmonary/Chest: Effort normal and breath sounds normal. No respiratory distress. She has no wheezes. She has no rales.  Musculoskeletal:       Thoracic back: She exhibits tenderness and spasm. She exhibits normal range of motion and no bony tenderness.       Lumbar back: Normal.  Skin: She is not diaphoretic.  Vitals reviewed.     Assessment & Plan:     1. Muscle spasm of back Spasm noted in the rhomboid muscles on the right side. We'll give Skelaxin as below for her to use for muscle relaxation. Advised of possible drowsiness precautions with this medication. She may cut in half if needed. She is to call if symptoms do not improve or if they worsen. - metaxalone (SKELAXIN) 400 MG tablet; Take 1 tablet (400 mg total) by mouth 3 (three) times daily.  Dispense: 30 tablet; Refill: 0  2. Borderline diabetes She is also in need of getting labs checked from last year. She can have these done at her work and will have them faxed over to our office. Labs were ordered as below. I will follow-up with her pending these lab results. - CBC with Differential - TSH -  HgB A1c  3. Abnormal liver enzymes I will check labs as below and follow-up pending results. - Comprehensive Metabolic Panel (CMET)  4. Hypercholesterolemia with hypertriglyceridemia I will check labs as below and follow-up pending results. - Lipid Profile       Mar Daring, PA-C  [facility]

## 2016-05-07 ENCOUNTER — Encounter: Payer: Self-pay | Admitting: Physician Assistant

## 2016-05-18 ENCOUNTER — Encounter: Payer: Self-pay | Admitting: Physician Assistant

## 2016-12-13 ENCOUNTER — Ambulatory Visit (INDEPENDENT_AMBULATORY_CARE_PROVIDER_SITE_OTHER): Payer: Self-pay | Admitting: Physician Assistant

## 2016-12-13 ENCOUNTER — Encounter: Payer: Self-pay | Admitting: Physician Assistant

## 2016-12-13 VITALS — BP 110/70 | HR 91 | Temp 98.4°F | Resp 16 | Wt 213.0 lb

## 2016-12-13 DIAGNOSIS — Z8261 Family history of arthritis: Secondary | ICD-10-CM

## 2016-12-13 DIAGNOSIS — G5603 Carpal tunnel syndrome, bilateral upper limbs: Secondary | ICD-10-CM

## 2016-12-13 DIAGNOSIS — M170 Bilateral primary osteoarthritis of knee: Secondary | ICD-10-CM

## 2016-12-13 DIAGNOSIS — M25551 Pain in right hip: Secondary | ICD-10-CM

## 2016-12-13 DIAGNOSIS — M2550 Pain in unspecified joint: Secondary | ICD-10-CM

## 2016-12-13 NOTE — Patient Instructions (Signed)
Joint Pain Introduction Joint pain can be caused by many things. The joint can be bruised, infected, weak from aging, or sore from exercise. The pain will probably go away if you follow your doctor's instructions for home care. If your joint pain continues, more tests may be needed to help find the cause of your condition. Follow these instructions at home: Watch your condition for any changes. Follow these instructions as told to lessen the pain that you are feeling:  Take medicines only as told by your doctor.  Rest the sore joint for as long as told by your doctor. If your doctor tells you to, raise (elevate) the painful joint above the level of your heart while you are sitting or lying down.  Do not do things that cause pain or make the pain worse.  If told, put ice on the painful area:  Put ice in a plastic bag.  Place a towel between your skin and the bag.  Leave the ice on for 20 minutes, 2-3 times per day.  Wear an elastic bandage, splint, or sling as told by your doctor. Loosen the bandage or splint if your fingers or toes lose feeling (become numb) and tingle, or if they turn cold and blue.  Begin exercising or stretching the joint as told by your doctor. Ask your doctor what types of exercise are safe for you.  Keep all follow-up visits as told by your doctor. This is important. Contact a doctor if:  Your pain gets worse and medicine does not help it.  Your joint pain does not get better in 3 days.  You have more bruising or swelling.  You have a fever.  You lose 10 pounds (4.5 kg) or more without trying. Get help right away if:  You are not able to move the joint.  Your fingers or toes become numb or they turn cold and blue. This information is not intended to replace advice given to you by your health care provider. Make sure you discuss any questions you have with your health care provider. Document Released: 10/06/2009 Document Revised: 03/25/2016 Document  Reviewed: 07/30/2014  2017 Elsevier

## 2016-12-13 NOTE — Progress Notes (Signed)
Patient: Annette Bradford Female    DOB: [birth year]   51 y.o.   MRN: [id number] Visit Date: 12/13/2016  Today's Provider: Mar Daring, PA-C   Chief Complaint  Patient presents with  . Back Pain  . Leg Pain   Subjective:    Back Pain  This is a new problem. The current episode started more than 1 month ago (For 6 months). The problem occurs intermittently. The problem has been gradually worsening since onset. The pain is present in the gluteal, lumbar spine, sacro-iliac and thoracic spine (all her back). The quality of the pain is described as aching (comes anf goes). The pain is at a severity of 10/10 (when it comes). The pain is moderate. The symptoms are aggravated by bending and position (any position). Associated symptoms include leg pain, numbness (in hands and legs) and tingling. Pertinent negatives include no chest pain, headaches or weakness. Treatments tried: OsteoBiFlex.  Leg Pain   There was no injury mechanism. The pain is present in the left knee, right knee, left leg and right leg. The quality of the pain is described as aching. The pain is moderate. The pain has been fluctuating since onset. Associated symptoms include numbness (in hands and legs) and tingling. Associated symptoms comments: Gait balance is not good. The symptoms are aggravated by movement.   Family history of RA in grandmother, mother and aunt. She has not been taking anything for her pains. She complains of bilateral hand numbness in the middle of the night, bilateral knee pain (has prev seen Dr. Caswell Corwin and was told she would need a TKR eventually), back pain through her whole spine, and right lateral hip pain (sometimes left also).     No Known Allergies   Current Outpatient Prescriptions:  .  aspirin 81 MG tablet, Take 81 mg by mouth daily., Disp: , Rfl:  .  Cholecalciferol (VITAMIN D3) 1000 units CAPS, Take by mouth., Disp: , Rfl:  .  Glucosamine-Chondroitin (OSTEO BI-FLEX REGULAR  STRENGTH PO), Take by mouth., Disp: , Rfl:  .  metaxalone (SKELAXIN) 400 MG tablet, Take 1 tablet (400 mg total) by mouth 3 (three) times daily., Disp: 30 tablet, Rfl: 0 .  MULTIPLE VITAMINS-MINERALS PO, Take 1 tablet by mouth daily., Disp: , Rfl:  .  OMEGA-3 FATTY ACIDS PO, Take 2 capsules by mouth daily., Disp: , Rfl:  .  Psyllium (METAMUCIL MULTIHEALTH FIBER) 55.46 % POWD, Take by mouth. , Disp: , Rfl:   Review of Systems  Constitutional: Negative.   Respiratory: Negative.   Cardiovascular: Positive for leg swelling. Negative for chest pain and palpitations.  Gastrointestinal: Negative.   Musculoskeletal: Positive for arthralgias, back pain, gait problem, myalgias and neck pain. Negative for neck stiffness.  Skin: Negative.   Neurological: Positive for tingling and numbness (in hands and legs). Negative for dizziness, weakness and headaches.    Social History  Substance Use Topics  . Smoking status: Former Research scientist (life sciences)  . Smokeless tobacco: Never Used  . Alcohol use 0.0 oz/week     Comment: OCCASIONALLY 1-2 TIMES A MONTH   Objective:   BP 110/70 (BP Location: Right Arm, Patient Position: Sitting, Cuff Size: Normal)   Pulse 91   Temp 98.4 F (36.9 C) (Oral)   Resp 16   Wt 213 lb (96.6 kg)   BMI 38.96 kg/m   Physical Exam  Constitutional: She appears well-developed and well-nourished. No distress.  Neck: Normal range of motion. Neck supple. No JVD  present. No tracheal deviation present. No thyromegaly present.  Cardiovascular: Normal rate, regular rhythm and normal heart sounds.  Exam reveals no gallop and no friction rub.   No murmur heard. Pulmonary/Chest: Effort normal and breath sounds normal. No respiratory distress. She has no wheezes. She has no rales.  Musculoskeletal: She exhibits no edema.       Right wrist: Normal.       Left wrist: Normal.       Right hip: She exhibits bony tenderness (over greater trochanter). She exhibits normal range of motion and normal strength.         Left hip: Normal.       Right knee: She exhibits normal range of motion, no swelling, no effusion, no LCL laxity, normal patellar mobility, no bony tenderness, normal meniscus and no MCL laxity. Tenderness found. Medial joint line and lateral joint line tenderness noted.       Left knee: She exhibits normal range of motion, no swelling, no deformity, no erythema, normal alignment, no LCL laxity, normal patellar mobility, no bony tenderness, normal meniscus and no MCL laxity. Tenderness found. Medial joint line and lateral joint line tenderness noted.       Cervical back: Normal.       Thoracic back: Normal.       Lumbar back: Normal.  Neg Tinel, Neg Phalen, Neg Finkelstein  Lymphadenopathy:    She has no cervical adenopathy.  Skin: She is not diaphoretic.  Vitals reviewed.      Assessment & Plan:     1. Pain of right hip joint Suspect bursitis. Patient refuses NSAID treatment and steroid tapers. Refuses Imaging. Refuses PT. Does accept referral to orthopedics, stating "I want somebody that is going to fix it all." - Ambulatory referral to Orthopedic Surgery  2. Bilateral carpal tunnel syndrome Suspect early carpal tunnel. Advised night splints. Refused all other treatment options. - Ambulatory referral to Orthopedic Surgery  3. Primary osteoarthritis of both knees Previously seen for this by Dr. Caswell Corwin. Reports she was told she was bone on bone. Will refer for further eval.  - Ambulatory referral to Orthopedic Surgery  4. Arthralgia, unspecified joint Reports family history of RA in mother, maternal aunt, and maternal grandmother. Will check labs as below for autoimmune source of multiple joint arthralgias.  - Rheumatoid Factor - ANA w/Reflex if Positive  5. Family history of rheumatoid arthritis See above medical treatment plan. - Rheumatoid Factor - ANA w/Reflex if Positive       Mar Daring, PA-C  [facility]

## 2016-12-14 LAB — ANA W/REFLEX IF POSITIVE: ANA: NEGATIVE

## 2016-12-14 LAB — RHEUMATOID FACTOR: RHEUMATOID FACTOR: 10.5 [IU]/mL (ref 0.0–13.9)

## 2016-12-15 ENCOUNTER — Telehealth: Payer: Self-pay

## 2016-12-15 NOTE — Telephone Encounter (Signed)
Advised patient of results.  

## 2016-12-15 NOTE — Telephone Encounter (Signed)
-----   Message from Mar Daring, [place] sent at 12/14/2016  2:08 PM EST ----- RF and ANA were both negative

## 2016-12-15 NOTE — Telephone Encounter (Signed)
LMTCB  Thanks,  -Derelle Cockrell 

## 2017-02-23 ENCOUNTER — Encounter: Payer: Self-pay | Admitting: Physician Assistant

## 2017-02-23 ENCOUNTER — Telehealth: Payer: Self-pay | Admitting: Physician Assistant

## 2017-02-23 NOTE — Telephone Encounter (Signed)
L/M stating below.  

## 2017-02-23 NOTE — Telephone Encounter (Signed)
Letter printed.

## 2017-02-23 NOTE — Telephone Encounter (Signed)
Pt is requesting a note for work stating she can work with no restrictions.  CB#[phone]/[id number]

## 2017-08-01 ENCOUNTER — Telehealth: Payer: Self-pay

## 2017-08-01 NOTE — Telephone Encounter (Signed)
Called patient to schedule CPE appointment. Patient reports that she will call us back to schedule as soon as possible. Patient reports that she is in the process of getting new insurance coverage. sd

## 2017-11-22 ENCOUNTER — Encounter: Payer: Self-pay | Admitting: Physician Assistant

## 2017-11-22 ENCOUNTER — Ambulatory Visit (INDEPENDENT_AMBULATORY_CARE_PROVIDER_SITE_OTHER): Payer: Commercial Managed Care - PPO | Admitting: Physician Assistant

## 2017-11-22 VITALS — BP 110/70 | HR 84 | Temp 98.5°F | Resp 16 | Ht 62.0 in | Wt 210.0 lb

## 2017-11-22 DIAGNOSIS — Z1231 Encounter for screening mammogram for malignant neoplasm of breast: Secondary | ICD-10-CM | POA: Diagnosis not present

## 2017-11-22 DIAGNOSIS — Z1239 Encounter for other screening for malignant neoplasm of breast: Secondary | ICD-10-CM

## 2017-11-22 DIAGNOSIS — Z0000 Encounter for general adult medical examination without abnormal findings: Secondary | ICD-10-CM | POA: Diagnosis not present

## 2017-11-22 DIAGNOSIS — E782 Mixed hyperlipidemia: Secondary | ICD-10-CM | POA: Diagnosis not present

## 2017-11-22 DIAGNOSIS — E559 Vitamin D deficiency, unspecified: Secondary | ICD-10-CM | POA: Diagnosis not present

## 2017-11-22 DIAGNOSIS — Z23 Encounter for immunization: Secondary | ICD-10-CM

## 2017-11-22 DIAGNOSIS — Z114 Encounter for screening for human immunodeficiency virus [HIV]: Secondary | ICD-10-CM | POA: Diagnosis not present

## 2017-11-22 DIAGNOSIS — Z124 Encounter for screening for malignant neoplasm of cervix: Secondary | ICD-10-CM | POA: Diagnosis not present

## 2017-11-22 DIAGNOSIS — R7303 Prediabetes: Secondary | ICD-10-CM | POA: Diagnosis not present

## 2017-11-22 NOTE — Patient Instructions (Signed)
Health Maintenance for Postmenopausal Women Menopause is a normal process in which your reproductive ability comes to an end. This process happens gradually over a span of months to years, usually between the ages of 22 and 9. Menopause is complete when you have missed 12 consecutive menstrual periods. It is important to talk with your health care provider about some of the most common conditions that affect postmenopausal women, such as heart disease, cancer, and bone loss (osteoporosis). Adopting a healthy lifestyle and getting preventive care can help to promote your health and wellness. Those actions can also lower your chances of developing some of these common conditions. What should I know about menopause? During menopause, you may experience a number of symptoms, such as:  Moderate-to-severe hot flashes.  Night sweats.  Decrease in sex drive.  Mood swings.  Headaches.  Tiredness.  Irritability.  Memory problems.  Insomnia.  Choosing to treat or not to treat menopausal changes is an individual decision that you make with your health care provider. What should I know about hormone replacement therapy and supplements? Hormone therapy products are effective for treating symptoms that are associated with menopause, such as hot flashes and night sweats. Hormone replacement carries certain risks, especially as you become older. If you are thinking about using estrogen or estrogen with progestin treatments, discuss the benefits and risks with your health care provider. What should I know about heart disease and stroke? Heart disease, heart attack, and stroke become more likely as you age. This may be due, in part, to the hormonal changes that your body experiences during menopause. These can affect how your body processes dietary fats, triglycerides, and cholesterol. Heart attack and stroke are both medical emergencies. There are many things that you can do to help prevent heart disease  and stroke:  Have your blood pressure checked at least every 1-2 years. High blood pressure causes heart disease and increases the risk of stroke.  If you are 53-22 years old, ask your health care provider if you should take aspirin to prevent a heart attack or a stroke.  Do not use any tobacco products, including cigarettes, chewing tobacco, or electronic cigarettes. If you need help quitting, ask your health care provider.  It is important to eat a healthy diet and maintain a healthy weight. ? Be sure to include plenty of vegetables, fruits, low-fat dairy products, and lean protein. ? Avoid eating foods that are high in solid fats, added sugars, or salt (sodium).  Get regular exercise. This is one of the most important things that you can do for your health. ? Try to exercise for at least 150 minutes each week. The type of exercise that you do should increase your heart rate and make you sweat. This is known as moderate-intensity exercise. ? Try to do strengthening exercises at least twice each week. Do these in addition to the moderate-intensity exercise.  Know your numbers.Ask your health care provider to check your cholesterol and your blood glucose. Continue to have your blood tested as directed by your health care provider.  What should I know about cancer screening? There are several types of cancer. Take the following steps to reduce your risk and to catch any cancer development as early as possible. Breast Cancer  Practice breast self-awareness. ? This means understanding how your breasts normally appear and feel. ? It also means doing regular breast self-exams. Let your health care provider know about any changes, no matter how small.  If you are 40  or older, have a clinician do a breast exam (clinical breast exam or CBE) every year. Depending on your age, family history, and medical history, it may be recommended that you also have a yearly breast X-ray (mammogram).  If you  have a family history of breast cancer, talk with your health care provider about genetic screening.  If you are at high risk for breast cancer, talk with your health care provider about having an MRI and a mammogram every year.  Breast cancer (BRCA) gene test is recommended for women who have family members with BRCA-related cancers. Results of the assessment will determine the need for genetic counseling and BRCA1 and for BRCA2 testing. BRCA-related cancers include these types: ? Breast. This occurs in males or females. ? Ovarian. ? Tubal. This may also be called fallopian tube cancer. ? Cancer of the abdominal or pelvic lining (peritoneal cancer). ? Prostate. ? Pancreatic.  Cervical, Uterine, and Ovarian Cancer Your health care provider may recommend that you be screened regularly for cancer of the pelvic organs. These include your ovaries, uterus, and vagina. This screening involves a pelvic exam, which includes checking for microscopic changes to the surface of your cervix (Pap test).  For women ages 21-65, health care providers may recommend a pelvic exam and a Pap test every three years. For women ages 79-65, they may recommend the Pap test and pelvic exam, combined with testing for human papilloma virus (HPV), every five years. Some types of HPV increase your risk of cervical cancer. Testing for HPV may also be done on women of any age who have unclear Pap test results.  Other health care providers may not recommend any screening for nonpregnant women who are considered low risk for pelvic cancer and have no symptoms. Ask your health care provider if a screening pelvic exam is right for you.  If you have had past treatment for cervical cancer or a condition that could lead to cancer, you need Pap tests and screening for cancer for at least 20 years after your treatment. If Pap tests have been discontinued for you, your risk factors (such as having a new sexual partner) need to be  reassessed to determine if you should start having screenings again. Some women have medical problems that increase the chance of getting cervical cancer. In these cases, your health care provider may recommend that you have screening and Pap tests more often.  If you have a family history of uterine cancer or ovarian cancer, talk with your health care provider about genetic screening.  If you have vaginal bleeding after reaching menopause, tell your health care provider.  There are currently no reliable tests available to screen for ovarian cancer.  Lung Cancer Lung cancer screening is recommended for adults 69-62 years old who are at high risk for lung cancer because of a history of smoking. A yearly low-dose CT scan of the lungs is recommended if you:  Currently smoke.  Have a history of at least 30 pack-years of smoking and you currently smoke or have quit within the past 15 years. A pack-year is smoking an average of one pack of cigarettes per day for one year.  Yearly screening should:  Continue until it has been 15 years since you quit.  Stop if you develop a health problem that would prevent you from having lung cancer treatment.  Colorectal Cancer  This type of cancer can be detected and can often be prevented.  Routine colorectal cancer screening usually begins at  age 42 and continues through age 45.  If you have risk factors for colon cancer, your health care provider may recommend that you be screened at an earlier age.  If you have a family history of colorectal cancer, talk with your health care provider about genetic screening.  Your health care provider may also recommend using home test kits to check for hidden blood in your stool.  A small camera at the end of a tube can be used to examine your colon directly (sigmoidoscopy or colonoscopy). This is done to check for the earliest forms of colorectal cancer.  Direct examination of the colon should be repeated every  5-10 years until age 71. However, if early forms of precancerous polyps or small growths are found or if you have a family history or genetic risk for colorectal cancer, you may need to be screened more often.  Skin Cancer  Check your skin from head to toe regularly.  Monitor any moles. Be sure to tell your health care provider: ? About any new moles or changes in moles, especially if there is a change in a mole's shape or color. ? If you have a mole that is larger than the size of a pencil eraser.  If any of your family members has a history of skin cancer, especially at a young age, talk with your health care provider about genetic screening.  Always use sunscreen. Apply sunscreen liberally and repeatedly throughout the day.  Whenever you are outside, protect yourself by wearing long sleeves, pants, a wide-brimmed hat, and sunglasses.  What should I know about osteoporosis? Osteoporosis is a condition in which bone destruction happens more quickly than new bone creation. After menopause, you may be at an increased risk for osteoporosis. To help prevent osteoporosis or the bone fractures that can happen because of osteoporosis, the following is recommended:  If you are 46-71 years old, get at least 1,000 mg of calcium and at least 600 mg of vitamin D per day.  If you are older than age 55 but younger than age 65, get at least 1,200 mg of calcium and at least 600 mg of vitamin D per day.  If you are older than age 54, get at least 1,200 mg of calcium and at least 800 mg of vitamin D per day.  Smoking and excessive alcohol intake increase the risk of osteoporosis. Eat foods that are rich in calcium and vitamin D, and do weight-bearing exercises several times each week as directed by your health care provider. What should I know about how menopause affects my mental health? Depression may occur at any age, but it is more common as you become older. Common symptoms of depression  include:  Low or sad mood.  Changes in sleep patterns.  Changes in appetite or eating patterns.  Feeling an overall lack of motivation or enjoyment of activities that you previously enjoyed.  Frequent crying spells.  Talk with your health care provider if you think that you are experiencing depression. What should I know about immunizations? It is important that you get and maintain your immunizations. These include:  Tetanus, diphtheria, and pertussis (Tdap) booster vaccine.  Influenza every year before the flu season begins.  Pneumonia vaccine.  Shingles vaccine.  Your health care provider may also recommend other immunizations. This information is not intended to replace advice given to you by your health care provider. Make sure you discuss any questions you have with your health care provider. Document Released: 12/10/2005  Document Revised: 05/07/2016 Document Reviewed: 07/22/2015 Elsevier Interactive Patient Education  2018 [employer] Inc[employer]  

## 2017-11-22 NOTE — Progress Notes (Signed)
Patient: Annette Bradford, Female    DOB: November 01, 1966, 52 y.o.   MRN: [id number] Visit Date: 11/22/2017  Today's Provider: Mar Daring, PA-C   Chief Complaint  Patient presents with  . Annual Exam   Subjective:    Annual physical exam [name] is a 52 y.o. female who presents today for health maintenance and complete physical. She feels well. She reports exercising 3 days a week 60 minutes. She reports she is sleeping well.  08/01/14 CPE 12/01/15 Mammogram-BI-RADS 2 12/06/2015 Colonoscopy- 3 polyps-tubular adenoma; internal hemorrhoids; Dr. Jonetta Osgood in 5 years  -----------------------------------------------------------------   Review of Systems  Constitutional: Negative.   HENT: Negative.   Eyes: Negative.   Respiratory: Negative.   Cardiovascular: Positive for leg swelling.  Gastrointestinal: Negative.   Endocrine: Negative.   Genitourinary: Negative.   Musculoskeletal: Positive for arthralgias, back pain, joint swelling and myalgias.  Skin: Negative.   Allergic/Immunologic: Negative.   Neurological: Negative.   Hematological: Negative.   Psychiatric/Behavioral: Negative.     Social History      She  reports that she has quit smoking. she has never used smokeless tobacco. She reports that she drinks alcohol. She reports that she does not use drugs.       Social History   Socioeconomic History  . Marital status: Married    Spouse name: None  . Number of children: None  . Years of education: None  . Highest education level: None  Social Needs  . Financial resource strain: None  . Food insecurity - worry: None  . Food insecurity - inability: None  . Transportation needs - medical: None  . Transportation needs - non-medical: None  Occupational History  . None  Tobacco Use  . Smoking status: Former Research scientist (life sciences)  . Smokeless tobacco: Never Used  Substance and Sexual Activity  . Alcohol use: Yes    Alcohol/week: 0.0 oz    Comment:  OCCASIONALLY 1-2 TIMES A MONTH  . Drug use: No  . Sexual activity: None  Other Topics Concern  . None  Social History Narrative  . None    No past medical history on file.   Patient Active Problem List   Diagnosis Date Noted  . Hypercholesterolemia with hypertriglyceridemia 04/23/2016  . Special screening for malignant neoplasms, colon   . Benign neoplasm of descending colon   . Benign neoplasm of sigmoid colon   . Rectal polyp   . Abnormal liver enzymes 04/16/2015  . Hot flash, menopausal 04/16/2015  . Hypercholesteremia 04/16/2015  . Post menopausal syndrome 04/16/2015  . Borderline diabetes 04/16/2015  . Avitaminosis D 04/16/2015  . Fibroid 04/16/2015  . Calcium blood increased 04/16/2015    Past Surgical History:  Procedure Laterality Date  . COLONOSCOPY WITH PROPOFOL N/A 12/26/2015   Procedure: COLONOSCOPY WITH PROPOFOL;  Surgeon: Lucilla Lame, MD;  Location: [facility];  Service: Endoscopy;  Laterality: N/A;  . NO PAST SURGERIES    . POLYPECTOMY  12/26/2015   Procedure: POLYPECTOMY;  Surgeon: Lucilla Lame, MD;  Location: [facility];  Service: Endoscopy;;    Family History        Family Status  Relation Name Status  . Mother  Alive  . Father  Deceased at age 8  . Sister  Alive  . Brother  Alive  . Son  Alive  . Sister  Alive  . Brother  Alive        Her family history includes Cancer in her  sister; Diabetes in her mother and sister; Heart attack in her father; Hypertension in her brother.     No Known Allergies   Current Outpatient Medications:  .  aspirin 81 MG tablet, Take 81 mg by mouth daily., Disp: , Rfl:  .  Cholecalciferol (VITAMIN D3) 1000 units CAPS, Take by mouth., Disp: , Rfl:  .  Glucosamine-Chondroitin (OSTEO BI-FLEX REGULAR STRENGTH PO), Take by mouth., Disp: , Rfl:  .  MULTIPLE VITAMINS-MINERALS PO, Take 1 tablet by mouth daily., Disp: , Rfl:  .  OMEGA-3 FATTY ACIDS PO, Take 2 capsules by mouth daily., Disp: , Rfl:  .   Psyllium (METAMUCIL MULTIHEALTH FIBER) 55.46 % POWD, Take by mouth. , Disp: , Rfl:    Patient Care Team: Rubye Beach as PCP - General (Physician Assistant)      Objective:   Vitals: BP 110/70 (BP Location: Left Arm, Patient Position: Sitting, Cuff Size: Large)   Pulse 84   Temp 98.5 F (36.9 C)   Resp 16   Ht 5\' 2"  (1.575 m)   Wt 210 lb (95.3 kg)   SpO2 96%   BMI 38.41 kg/m    Vitals:   11/22/17 0917  BP: 110/70  Pulse: 84  Resp: 16  Temp: 98.5 F (36.9 C)  SpO2: 96%  Weight: 210 lb (95.3 kg)  Height: 5\' 2"  (1.575 m)     Physical Exam  Constitutional: She is oriented to person, place, and time. She appears well-developed and well-nourished. No distress.  HENT:  Head: Normocephalic and atraumatic.  Right Ear: Hearing, tympanic membrane, external ear and ear canal normal.  Left Ear: Hearing, tympanic membrane, external ear and ear canal normal.  Nose: Nose normal.  Mouth/Throat: Uvula is midline, oropharynx is clear and moist and mucous membranes are normal. No oropharyngeal exudate.  Eyes: Conjunctivae and EOM are normal. Pupils are equal, round, and reactive to light. Right eye exhibits no discharge. Left eye exhibits no discharge. No scleral icterus.  Neck: Normal range of motion. Neck supple. No JVD present. Carotid bruit is not present. No tracheal deviation present. No thyromegaly present.  Cardiovascular: Normal rate, regular rhythm, normal heart sounds and intact distal pulses. Exam reveals no gallop and no friction rub.  No murmur heard. Pulmonary/Chest: Effort normal and breath sounds normal. No respiratory distress. She has no wheezes. She has no rales. She exhibits no tenderness. Right breast exhibits no inverted nipple, no mass, no nipple discharge, no skin change and no tenderness. Left breast exhibits no inverted nipple, no mass, no nipple discharge, no skin change and no tenderness. Breasts are symmetrical.  Abdominal: Soft. Bowel sounds are  normal. She exhibits no distension and no mass. There is no tenderness. There is no rebound and no guarding. Hernia confirmed negative in the right inguinal area and confirmed negative in the left inguinal area.  Genitourinary: Rectum normal, vagina normal and uterus normal. No breast swelling, tenderness, discharge or bleeding. Pelvic exam was performed with patient supine. There is no rash, tenderness, lesion or injury on the right labia. There is no rash, tenderness, lesion or injury on the left labia. Cervix exhibits no motion tenderness, no discharge and no friability. Right adnexum displays no mass, no tenderness and no fullness. Left adnexum displays no mass, no tenderness and no fullness. No erythema, tenderness or bleeding in the vagina. No signs of injury around the vagina. No vaginal discharge found.  Musculoskeletal: Normal range of motion. She exhibits no edema or tenderness.  Lymphadenopathy:  She has no cervical adenopathy.       Right: No inguinal adenopathy present.       Left: No inguinal adenopathy present.  Neurological: She is alert and oriented to person, place, and time. She has normal reflexes. No cranial nerve deficit. Coordination normal.  Skin: Skin is warm and dry. No rash noted. She is not diaphoretic.  Psychiatric: She has a normal mood and affect. Her behavior is normal. Judgment and thought content normal.  Vitals reviewed.    Depression Screen No flowsheet data found.    Assessment & Plan:     Routine Health Maintenance and Physical Exam  Exercise Activities and Dietary recommendations Goals    None       There is no immunization history on file for this patient.  Health Maintenance  Topic Date Due  . HIV Screening  02/23/1980  . TETANUS/TDAP  02/23/1984  . PAP SMEAR  02/22/1986  . MAMMOGRAM  11/30/2016  . INFLUENZA VACCINE  06/01/2017  . COLONOSCOPY  12/25/2020     Discussed health benefits of physical activity, and encouraged her to  engage in regular exercise appropriate for her age and condition.    1. Annual physical exam Normal physical exam today. Will check labs as below and f/u pending lab results. If labs are stable and WNL she will not need to have these rechecked for one year at her next annual physical exam. She is to call the office in the meantime if she has any acute issue, questions or concerns. - CBC with Differential/Platelet - Comprehensive metabolic panel - TSH  2. Screening for breast cancer Patient has new insurance and is going to call around to see who takes her insurance to call to schedule her mammogram. Breast exam today was normal. She does perform self breast exams.   3. Cervical cancer screening Pap collected today. Will send as below and f/u pending results. - Pap IG and HPV (high risk) DNA detection  4. Borderline diabetes Diet controlled. Positive family history as well in a sister and mother. Will check labs as below and f/u pending results. - Hemoglobin A1c  5. Hypercholesterolemia with hypertriglyceridemia Diet controlled. Family history positive for CAD. On daily low dose ASA. Will check labs as below and f/u pending results. - Lipid Panel With LDL/HDL Ratio  6. Avitaminosis D H/O this. Not on supplementation. Will check labs as below and f/u pending results. - VITAMIN D 25 Hydroxy (Vit-D Deficiency, Fractures)  7. Encounter for screening for HIV - HIV antibody  8. Need for Td vaccine Td booster given to patient without complications. Patient sat for 15 minutes after administration and was tolerated well without adverse effects. - Td vaccine greater than or equal to 7yo preservative free IM  --------------------------------------------------------------------    Mar Daring, PA-C  [facility]

## 2017-11-23 LAB — COMPREHENSIVE METABOLIC PANEL
ALT: 38 IU/L — AB (ref 0–32)
AST: 29 IU/L (ref 0–40)
Albumin/Globulin Ratio: 1.7 (ref 1.2–2.2)
Albumin: 4.7 g/dL (ref 3.5–5.5)
Alkaline Phosphatase: 67 IU/L (ref 39–117)
BUN/Creatinine Ratio: 19 (ref 9–23)
BUN: 13 mg/dL (ref 6–24)
Bilirubin Total: 0.3 mg/dL (ref 0.0–1.2)
CALCIUM: 9.8 mg/dL (ref 8.7–10.2)
CO2: 24 mmol/L (ref 20–29)
CREATININE: 0.67 mg/dL (ref 0.57–1.00)
Chloride: 102 mmol/L (ref 96–106)
GFR, EST AFRICAN AMERICAN: 117 mL/min/{1.73_m2} (ref >59)
GFR, EST NON AFRICAN AMERICAN: 101 mL/min/{1.73_m2} (ref >59)
GLUCOSE: 86 mg/dL (ref 65–99)
Globulin, Total: 2.8 g/dL (ref 1.5–4.5)
Potassium: 4.1 mmol/L (ref 3.5–5.2)
Sodium: 140 mmol/L (ref 134–144)
TOTAL PROTEIN: 7.5 g/dL (ref 6.0–8.5)

## 2017-11-23 LAB — CBC WITH DIFFERENTIAL/PLATELET
BASOS ABS: 0 10*3/uL (ref 0.0–0.2)
BASOS: 0 %
EOS (ABSOLUTE): 0.1 10*3/uL (ref 0.0–0.4)
Eos: 2 %
HEMOGLOBIN: 13.3 g/dL (ref 11.1–15.9)
Hematocrit: 39.1 % (ref 34.0–46.6)
IMMATURE GRANULOCYTES: 0 %
Immature Grans (Abs): 0 10*3/uL (ref 0.0–0.1)
Lymphocytes Absolute: 3.1 10*3/uL (ref 0.7–3.1)
Lymphs: 45 %
MCH: 33 pg (ref 26.6–33.0)
MCHC: 34 g/dL (ref 31.5–35.7)
MCV: 97 fL (ref 79–97)
MONOCYTES: 6 %
Monocytes Absolute: 0.4 10*3/uL (ref 0.1–0.9)
NEUTROS PCT: 47 %
Neutrophils Absolute: 3.3 10*3/uL (ref 1.4–7.0)
PLATELETS: 269 10*3/uL (ref 150–379)
RBC: 4.03 x10E6/uL (ref 3.77–5.28)
RDW: 14.3 % (ref 12.3–15.4)
WBC: 7 10*3/uL (ref 3.4–10.8)

## 2017-11-23 LAB — LIPID PANEL WITH LDL/HDL RATIO
CHOLESTEROL TOTAL: 237 mg/dL — AB (ref 100–199)
HDL: 67 mg/dL (ref >39)
LDL Calculated: 145 mg/dL — ABNORMAL HIGH (ref 0–99)
LDl/HDL Ratio: 2.2 ratio (ref 0.0–3.2)
TRIGLYCERIDES: 127 mg/dL (ref 0–149)
VLDL CHOLESTEROL CAL: 25 mg/dL (ref 5–40)

## 2017-11-23 LAB — TSH: TSH: 1.15 u[IU]/mL (ref 0.450–4.500)

## 2017-11-23 LAB — VITAMIN D 25 HYDROXY (VIT D DEFICIENCY, FRACTURES): Vit D, 25-Hydroxy: 41.3 ng/mL (ref 30.0–100.0)

## 2017-11-23 LAB — HEMOGLOBIN A1C
Est. average glucose Bld gHb Est-mCnc: 120 mg/dL
Hgb A1c MFr Bld: 5.8 % — ABNORMAL HIGH (ref 4.8–5.6)

## 2017-11-23 LAB — HIV ANTIBODY (ROUTINE TESTING W REFLEX): HIV Screen 4th Generation wRfx: NONREACTIVE

## 2017-11-24 LAB — PAP IG AND HPV HIGH-RISK
HPV, high-risk: NEGATIVE
PAP Smear Comment: 0

## 2017-11-25 ENCOUNTER — Telehealth: Payer: Self-pay

## 2017-11-25 NOTE — Progress Notes (Signed)
Patietn advised and plans to wait until next year for pap

## 2017-11-25 NOTE — Telephone Encounter (Signed)
-----   Message from Mar Daring, [place] sent at 11/24/2017  1:41 PM EST ----- Pap did come back abnormal for atypical cells of undetermined significance, but is HPV negative which is more reassuring. This is the most common atypical finding on pap. With it being HPV negative we normally just recheck pap in 1 year. If wanted, however, we can always refer to GYN for abnormal pap.

## 2017-11-25 NOTE — Telephone Encounter (Signed)
lmtcb

## 2017-11-30 LAB — HM MAMMOGRAPHY

## 2017-12-01 ENCOUNTER — Telehealth: Payer: Self-pay

## 2017-12-01 ENCOUNTER — Encounter: Payer: Self-pay | Admitting: Physician Assistant

## 2017-12-01 NOTE — Telephone Encounter (Signed)
Patient advised of normal mammogram. Patient reports she has been advised of labs and pap results also.

## 2017-12-01 NOTE — Progress Notes (Signed)
o

## 2018-06-27 ENCOUNTER — Telehealth: Payer: Self-pay

## 2018-06-27 NOTE — Telephone Encounter (Signed)
LM that her proof of physical form for [facility] is ready for for pick up and is placed up front.  Thanks,  -Joseline

## 2018-12-08 LAB — HM MAMMOGRAPHY

## 2019-04-03 NOTE — Progress Notes (Signed)
Patient: Annette Bradford, Female    DOB: January 03, 1966, 53 y.o.   MRN: [id number] Visit Date: 04/05/2019  Today's Provider: Mar Daring, PA-C   Chief Complaint  Patient presents with  . Annual Exam   Subjective:    I,[name],RMA am acting as a Education administrator for [employer], PA-C.  Annual physical exam [name] is a 53 y.o. female who presents today for health maintenance and complete physical. She feels fairly well. She reports exercising. She reports she is sleeping well. ----------------------------------------------------------------- She has a lump on the right side of her neck and notice it on Saturday. No pain. Reports that on Saturday it look swollen. No sore throat or URI.  Review of Systems  Constitutional: Negative.   HENT: Negative.   Eyes: Negative.   Respiratory: Negative.   Cardiovascular: Negative.   Gastrointestinal: Positive for abdominal pain.  Endocrine: Negative.   Genitourinary: Negative.   Musculoskeletal: Positive for arthralgias, back pain and myalgias.  Skin: Negative.   Allergic/Immunologic: Negative.   Neurological: Positive for dizziness.  Hematological: Positive for adenopathy.  Psychiatric/Behavioral: Negative.     Social History      She  reports that she has quit smoking. She has never used smokeless tobacco. She reports current alcohol use. She reports that she does not use drugs.       Social History   Socioeconomic History  . Marital status: Married    Spouse name: Not on file  . Number of children: Not on file  . Years of education: Not on file  . Highest education level: Not on file  Occupational History  . Not on file  Social Needs  . Financial resource strain: Not on file  . Food insecurity:    Worry: Not on file    Inability: Not on file  . Transportation needs:    Medical: Not on file    Non-medical: Not on file  Tobacco Use  . Smoking status: Former Research scientist (life sciences)  . Smokeless tobacco:  Never Used  Substance and Sexual Activity  . Alcohol use: Yes    Alcohol/week: 0.0 standard drinks    Comment: OCCASIONALLY 1-2 TIMES A MONTH  . Drug use: No  . Sexual activity: Not on file  Lifestyle  . Physical activity:    Days per week: Not on file    Minutes per session: Not on file  . Stress: Not on file  Relationships  . Social connections:    Talks on phone: Not on file    Gets together: Not on file    Attends religious service: Not on file    Active member of club or organization: Not on file    Attends meetings of clubs or organizations: Not on file    Relationship status: Not on file  Other Topics Concern  . Not on file  Social History Narrative  . Not on file    No past medical history on file.   Patient Active Problem List   Diagnosis Date Noted  . Hypercholesterolemia with hypertriglyceridemia 04/23/2016  . Special screening for malignant neoplasms, colon   . Benign neoplasm of descending colon   . Benign neoplasm of sigmoid colon   . Rectal polyp   . Abnormal liver enzymes 04/16/2015  . Hot flash, menopausal 04/16/2015  . Hypercholesteremia 04/16/2015  . Post menopausal syndrome 04/16/2015  . Borderline diabetes 04/16/2015  . Avitaminosis D 04/16/2015  . Fibroid 04/16/2015  . Calcium blood increased  04/16/2015    Past Surgical History:  Procedure Laterality Date  . COLONOSCOPY WITH PROPOFOL N/A 12/26/2015   Procedure: COLONOSCOPY WITH PROPOFOL;  Surgeon: Lucilla Lame, MD;  Location: [facility];  Service: Endoscopy;  Laterality: N/A;  . NO PAST SURGERIES    . POLYPECTOMY  12/26/2015   Procedure: POLYPECTOMY;  Surgeon: Lucilla Lame, MD;  Location: [place];  Service: Endoscopy;;    Family History        Family Status  Relation Name Status  . Mother  Alive  . Father  Deceased at age 41  . Sister  Alive  . Brother  Alive  . Son  Alive  . Sister  Alive  . Brother  Alive        Her family history includes Cancer in her  sister; Diabetes in her mother and sister; Heart attack in her father; Hypertension in her brother.      No Known Allergies   Current Outpatient Medications:  .  aspirin 81 MG tablet, Take 81 mg by mouth daily., Disp: , Rfl:  .  Cholecalciferol (VITAMIN D3) 1000 units CAPS, Take by mouth., Disp: , Rfl:  .  Glucosamine-Chondroitin (OSTEO BI-FLEX REGULAR STRENGTH PO), Take by mouth., Disp: , Rfl:  .  MULTIPLE VITAMINS-MINERALS PO, Take 1 tablet by mouth daily., Disp: , Rfl:  .  Psyllium (METAMUCIL MULTIHEALTH FIBER) 55.46 % POWD, Take by mouth. , Disp: , Rfl:  .  OMEGA-3 FATTY ACIDS PO, Take 2 capsules by mouth daily., Disp: , Rfl:    Patient Care Team: Rubye Beach as PCP - General (Physician Assistant)    Objective:    Vitals: BP 113/77 (BP Location: Left Arm, Patient Position: Sitting, Cuff Size: Large)   Pulse 85   Temp 99 F (37.2 C) (Oral)   Resp 16   Ht 5\' 2"  (1.575 m)   Wt 211 lb 6.4 oz (95.9 kg)   BMI 38.67 kg/m    Vitals:   04/05/19 0918  BP: 113/77  Pulse: 85  Resp: 16  Temp: 99 F (37.2 C)  TempSrc: Oral  Weight: 211 lb 6.4 oz (95.9 kg)  Height: 5\' 2"  (1.575 m)     Physical Exam Vitals signs reviewed.  Constitutional:      General: She is not in acute distress.    Appearance: Normal appearance. She is well-developed. She is obese. She is not ill-appearing or diaphoretic.  HENT:     Head: Normocephalic and atraumatic.     Right Ear: Hearing, tympanic membrane, ear canal and external ear normal.     Left Ear: Hearing, tympanic membrane, ear canal and external ear normal.     Nose: Nose normal.     Mouth/Throat:     Mouth: Mucous membranes are moist.     Pharynx: Oropharynx is clear. Uvula midline. No oropharyngeal exudate.  Eyes:     General: No scleral icterus.       Right eye: No discharge.        Left eye: No discharge.     Extraocular Movements: Extraocular movements intact.     Conjunctiva/sclera: Conjunctivae normal.     Pupils:  Pupils are equal, round, and reactive to light.  Neck:     Musculoskeletal: Normal range of motion and neck supple.     Thyroid: No thyromegaly.     Vascular: No carotid bruit or JVD.     Trachea: No tracheal deviation.   Cardiovascular:     Rate and  Rhythm: Normal rate and regular rhythm.     Pulses: Normal pulses.     Heart sounds: Normal heart sounds. No murmur. No friction rub. No gallop.   Pulmonary:     Effort: Pulmonary effort is normal. No respiratory distress.     Breath sounds: Normal breath sounds. No wheezing or rales.  Chest:     Chest wall: No tenderness.     Breasts: Breasts are symmetrical.        Right: No inverted nipple, mass, nipple discharge, skin change or tenderness.        Left: No inverted nipple, mass, nipple discharge, skin change or tenderness.  Abdominal:     General: Bowel sounds are normal. There is no distension.     Palpations: Abdomen is soft. There is no mass.     Tenderness: There is no abdominal tenderness. There is no guarding or rebound.     Hernia: There is no hernia in the left inguinal area.  Genitourinary:    General: Normal vulva.     Exam position: Supine.     Labia:        Right: No rash, tenderness, lesion or injury.        Left: No rash, tenderness, lesion or injury.      Vagina: Normal. No signs of injury. No vaginal discharge, erythema, tenderness or bleeding.     Cervix: No cervical motion tenderness, discharge or friability.     Adnexa:        Right: No mass, tenderness or fullness.         Left: No mass, tenderness or fullness.       Rectum: Normal.  Musculoskeletal: Normal range of motion.        General: No tenderness.  Lymphadenopathy:     Cervical: No cervical adenopathy.  Skin:    General: Skin is warm and dry.     Findings: No rash.  Neurological:     Mental Status: She is alert and oriented to person, place, and time.     Cranial Nerves: No cranial nerve deficit.     Coordination: Coordination normal.     Deep  Tendon Reflexes: Reflexes are normal and symmetric.  Psychiatric:        Mood and Affect: Mood normal.        Behavior: Behavior normal.        Thought Content: Thought content normal.        Judgment: Judgment normal.      Depression Screen PHQ 2/9 Scores 04/05/2019 11/22/2017  PHQ - 2 Score 0 0  PHQ- 9 Score - 0       Assessment & Plan:     Routine Health Maintenance and Physical Exam  Exercise Activities and Dietary recommendations Goals   None     Immunization History  Administered Date(s) Administered  . Influenza-Unspecified 09/20/2017  . Td 07/07/1994, 06/27/2006, 11/22/2017  . Tdap 06/27/2006    Health Maintenance  Topic Date Due  . MAMMOGRAM  11/30/2018  . INFLUENZA VACCINE  06/02/2019  . PAP SMEAR-Modifier  11/22/2020  . COLONOSCOPY  12/25/2020  . TETANUS/TDAP  11/23/2027  . HIV Screening  Completed     Discussed health benefits of physical activity, and encouraged her to engage in regular exercise appropriate for her age and condition.    1. Annual physical exam Normal physical exam today. Will check labs as below and f/u pending lab results. If labs are stable and WNL she will not need to  have these rechecked for one year at her next annual physical exam. She is to call the office in the meantime if she has any acute issue, questions or concerns. - CBC with Differential/Platelet - Comprehensive metabolic panel - Lipid panel - TSH - Hemoglobin A1c  2. Borderline diabetes Will check labs as below and f/u pending results. - Comprehensive metabolic panel - Lipid panel - Hemoglobin A1c  3. Hypercholesterolemia with hypertriglyceridemia Diet controlled. Will check labs as below and f/u pending results. - Comprehensive metabolic panel - Lipid panel - Hemoglobin A1c  4. ASCUS of cervix with negative high risk HPV Had abnormal pap last year. Repeat pap and f/u pending results.  - Cytology - PAP  5. Salivary stone Submandibular gland swollen on  the right side, subtly. Non tender, not red. Suspect salivary stone. Advised to try increasing fluids and sour candies. If not improving call so we can obtain an [place]. She agrees.   --------------------------------------------------------------------    Mar Daring, PA-C  [facility]

## 2019-04-05 ENCOUNTER — Other Ambulatory Visit (HOSPITAL_COMMUNITY)
Admission: RE | Admit: 2019-04-05 | Discharge: 2019-04-05 | Disposition: A | Discharge status: Home / Self Care | Payer: Commercial Managed Care - PPO | Source: Ambulatory Visit | Attending: Physician Assistant | Admitting: Physician Assistant

## 2019-04-05 ENCOUNTER — Other Ambulatory Visit: Payer: Self-pay

## 2019-04-05 ENCOUNTER — Ambulatory Visit (INDEPENDENT_AMBULATORY_CARE_PROVIDER_SITE_OTHER): Payer: Commercial Managed Care - PPO | Admitting: Physician Assistant

## 2019-04-05 ENCOUNTER — Encounter: Payer: Self-pay | Admitting: Physician Assistant

## 2019-04-05 VITALS — BP 113/77 | HR 85 | Temp 99.0°F | Resp 16 | Ht 62.0 in | Wt 211.4 lb

## 2019-04-05 DIAGNOSIS — R87610 Atypical squamous cells of undetermined significance on cytologic smear of cervix (ASC-US): Secondary | ICD-10-CM | POA: Diagnosis present

## 2019-04-05 DIAGNOSIS — R7303 Prediabetes: Secondary | ICD-10-CM

## 2019-04-05 DIAGNOSIS — Z0000 Encounter for general adult medical examination without abnormal findings: Secondary | ICD-10-CM

## 2019-04-05 DIAGNOSIS — R87618 Other abnormal cytological findings on specimens from cervix uteri: Secondary | ICD-10-CM

## 2019-04-05 DIAGNOSIS — E782 Mixed hyperlipidemia: Secondary | ICD-10-CM | POA: Diagnosis not present

## 2019-04-05 DIAGNOSIS — K115 Sialolithiasis: Secondary | ICD-10-CM

## 2019-04-05 NOTE — Patient Instructions (Addendum)
Salivary Stone  A salivary stone is a mineral deposit that builds up in the ducts that drain your salivary glands. Most salivary stones are made of calcium. When a stone forms, saliva can back up into the gland and cause painful swelling. Your salivary glands are the glands that produce saliva. You have six major salivary glands. Each gland has a duct that carries saliva into your mouth. Saliva keeps your mouth moist and breaks down the food that you eat. It also helps prevent tooth decay. Two salivary glands are located just in front of your ears (parotid). The ducts for these glands open up inside your cheeks, near your back teeth. You also have two glands under your tongue (sublingual) and two glands under your jaw (submandibular). The ducts for these glands open under your tongue. A stone can form in any salivary gland. The most common place for a salivary stone to develop is in a submandibular salivary gland. What are the causes? Salivary stones may be caused by any condition that reduces the flow of saliva. It is not known why some people form stones. What increases the risk? You are more likely to develop this condition if:  You are female.  You do not drink enough water.  You smoke.  You have any of the following: ? High blood pressure. ? Gout. ? Diabetes. What are the signs or symptoms? The main sign of a salivary gland stone is sudden swelling of a salivary gland when eating. This usually happens under the jaw on one side. Other signs and symptoms may include:  Swelling of the cheek or under the tongue when eating.  Pain in the swollen area.  Trouble chewing or swallowing.  Swelling that goes down after eating. How is this diagnosed? This condition may be diagnosed based on:  Your signs and symptoms.  A physical exam. In many cases, your health care provider will be able to feel the stone in a duct inside your mouth.  Imaging studies, such as: ? X-rays. ? Ultrasound. ?  CT scan. ? MRI. You may need to see an ear, nose, and throat specialist (ENT or otolaryngologist) for diagnosis and treatment. How is this treated? Treatment for this condition depends on the size of the stone.  A small stone that is not causing symptoms may be treated with home care.  For a stone that is large enough to cause symptoms, the treatment options may include: ? Probing and widening of the duct to allow the stone to pass. ? Inserting a thin, flexible scope (endoscope) into the duct to locate and remove the stone. ? Breaking up the stone with sound waves. ? Removing the entire salivary gland. Follow these instructions at home:  To relieve discomfort  Follow these instructions every few hours: ? Suck on a lemon candy to stimulate the flow of saliva. ? Put a warm compress over the gland. ? Gently massage the gland. General instructions  Drink enough fluid to keep your urine pale yellow.  Do not use any products that contain nicotine or tobacco, such as cigarettes and e-cigarettes. If you need help quitting, ask your health care provider.

## 2019-04-06 ENCOUNTER — Telehealth: Payer: Self-pay | Admitting: *Deleted

## 2019-04-06 LAB — COMPREHENSIVE METABOLIC PANEL
ALT: 60 IU/L — ABNORMAL HIGH (ref 0–32)
AST: 54 IU/L — ABNORMAL HIGH (ref 0–40)
Albumin/Globulin Ratio: 1.7 (ref 1.2–2.2)
Albumin: 4.6 g/dL (ref 3.8–4.9)
Alkaline Phosphatase: 69 IU/L (ref 39–117)
BUN/Creatinine Ratio: 19 (ref 9–23)
BUN: 16 mg/dL (ref 6–24)
Bilirubin Total: 0.3 mg/dL (ref 0.0–1.2)
CO2: 22 mmol/L (ref 20–29)
Calcium: 10 mg/dL (ref 8.7–10.2)
Chloride: 101 mmol/L (ref 96–106)
Creatinine, Ser: 0.84 mg/dL (ref 0.57–1.00)
GFR calc Af Amer: 91 mL/min/{1.73_m2} (ref >59)
GFR calc non Af Amer: 79 mL/min/{1.73_m2} (ref >59)
Globulin, Total: 2.7 g/dL (ref 1.5–4.5)
Glucose: 84 mg/dL (ref 65–99)
Potassium: 4.3 mmol/L (ref 3.5–5.2)
Sodium: 140 mmol/L (ref 134–144)
Total Protein: 7.3 g/dL (ref 6.0–8.5)

## 2019-04-06 LAB — LIPID PANEL
Chol/HDL Ratio: 3.5 ratio (ref 0.0–4.4)
Cholesterol, Total: 233 mg/dL — ABNORMAL HIGH (ref 100–199)
HDL: 66 mg/dL (ref >39)
LDL Calculated: 143 mg/dL — ABNORMAL HIGH (ref 0–99)
Triglycerides: 121 mg/dL (ref 0–149)
VLDL Cholesterol Cal: 24 mg/dL (ref 5–40)

## 2019-04-06 LAB — CBC WITH DIFFERENTIAL/PLATELET
Basophils Absolute: 0 10*3/uL (ref 0.0–0.2)
Basos: 1 %
EOS (ABSOLUTE): 0.1 10*3/uL (ref 0.0–0.4)
Eos: 1 %
Hematocrit: 40.8 % (ref 34.0–46.6)
Hemoglobin: 14.1 g/dL (ref 11.1–15.9)
Immature Grans (Abs): 0 10*3/uL (ref 0.0–0.1)
Immature Granulocytes: 0 %
Lymphocytes Absolute: 2.6 10*3/uL (ref 0.7–3.1)
Lymphs: 41 %
MCH: 32.8 pg (ref 26.6–33.0)
MCHC: 34.6 g/dL (ref 31.5–35.7)
MCV: 95 fL (ref 79–97)
Monocytes Absolute: 0.5 10*3/uL (ref 0.1–0.9)
Monocytes: 7 %
Neutrophils Absolute: 3.2 10*3/uL (ref 1.4–7.0)
Neutrophils: 50 %
Platelets: 246 10*3/uL (ref 150–450)
RBC: 4.3 x10E6/uL (ref 3.77–5.28)
RDW: 12.4 % (ref 11.7–15.4)
WBC: 6.4 10*3/uL (ref 3.4–10.8)

## 2019-04-06 LAB — CYTOLOGY - PAP
Diagnosis: NEGATIVE
HPV: NOT DETECTED

## 2019-04-06 LAB — HEMOGLOBIN A1C
Est. average glucose Bld gHb Est-mCnc: 123 mg/dL
Hgb A1c MFr Bld: 5.9 % — ABNORMAL HIGH (ref 4.8–5.6)

## 2019-04-06 LAB — TSH: TSH: 0.749 u[IU]/mL (ref 0.450–4.500)

## 2019-04-06 NOTE — Telephone Encounter (Signed)
-----   Message from Mar Daring, [place] sent at 04/06/2019  1:41 PM EDT ----- Blood count is normal. Kidney function is normal. Sodium, potassium and calcium are normal. Liver enzymes are borderline elevated and up from last year. Make sure to limit fatty foods, tylenol (acetaminophen) based products, and alcohol from diet. Cholesterol elevated but stable compared to last year. Thyroid is normal. A1c is borderline high at 5.9. For cholesterol and A1c recommend to try to follow healthy lifestyle modifications with healthy dieting (limiting fatty foods, processed foods, red meats, and sugar) and increasing physical activity as tolerated. If desired we can recheck the liver enzymes in 3 months after trying to limit fatty foods, tylenol and alcohol.

## 2019-04-06 NOTE — Telephone Encounter (Signed)
LMOVM for pt to return call 

## 2019-04-09 NOTE — Telephone Encounter (Signed)
-----   Message from Mar Daring, [place] sent at 04/06/2019  4:37 PM EDT ----- Pap is normal, HPV negative.  Will repeat in 5 years.

## 2019-04-09 NOTE — Telephone Encounter (Signed)
LMTCB on vm

## 2019-04-09 NOTE — Telephone Encounter (Signed)
Patient advised as below.  

## 2019-04-09 NOTE — Telephone Encounter (Signed)
LMOVM to return call.

## 2019-07-11 ENCOUNTER — Ambulatory Visit: Payer: Self-pay | Admitting: Physician Assistant

## 2019-08-06 ENCOUNTER — Ambulatory Visit: Payer: Self-pay | Admitting: Physician Assistant

## 2019-12-11 LAB — HM MAMMOGRAPHY

## 2019-12-17 ENCOUNTER — Encounter: Payer: Self-pay | Admitting: Physician Assistant

## 2020-07-14 ENCOUNTER — Other Ambulatory Visit: Payer: Self-pay

## 2020-07-14 ENCOUNTER — Ambulatory Visit (INDEPENDENT_AMBULATORY_CARE_PROVIDER_SITE_OTHER): Payer: Commercial Managed Care - PPO | Admitting: Physician Assistant

## 2020-07-14 ENCOUNTER — Encounter: Payer: Self-pay | Admitting: Physician Assistant

## 2020-07-14 VITALS — BP 94/60 | HR 105 | Temp 99.1°F | Resp 16 | Ht 62.0 in | Wt 209.2 lb

## 2020-07-14 DIAGNOSIS — E782 Mixed hyperlipidemia: Secondary | ICD-10-CM

## 2020-07-14 DIAGNOSIS — Z0000 Encounter for general adult medical examination without abnormal findings: Secondary | ICD-10-CM | POA: Diagnosis not present

## 2020-07-14 DIAGNOSIS — R7303 Prediabetes: Secondary | ICD-10-CM | POA: Diagnosis not present

## 2020-07-14 DIAGNOSIS — M170 Bilateral primary osteoarthritis of knee: Secondary | ICD-10-CM

## 2020-07-14 DIAGNOSIS — B379 Candidiasis, unspecified: Secondary | ICD-10-CM | POA: Diagnosis not present

## 2020-07-14 DIAGNOSIS — Z1159 Encounter for screening for other viral diseases: Secondary | ICD-10-CM

## 2020-07-14 DIAGNOSIS — E559 Vitamin D deficiency, unspecified: Secondary | ICD-10-CM

## 2020-07-14 DIAGNOSIS — Z6838 Body mass index (BMI) 38.0-38.9, adult: Secondary | ICD-10-CM

## 2020-07-14 HISTORY — DX: Bilateral primary osteoarthritis of knee: M17.0

## 2020-07-14 MED ORDER — TERCONAZOLE 0.4 % VA CREA: 1.0000 | TOPICAL_CREAM | Freq: Every day | VAGINAL | 0 refills | Status: DC

## 2020-07-14 NOTE — Progress Notes (Signed)
Complete physical exam   Patient: Annette Bradford   DOB: Jan 28, 1966   54 y.o. Female  MRN: [id number] Visit Date: 07/14/2020  Today's healthcare provider: Mar Daring, PA-C   Chief Complaint  Patient presents with  . Annual Exam   Subjective    Annette Bradford is a 54 y.o. female who presents today for a complete physical exam.  She reports consuming a general diet. Home exercise routine includes does abs launcher, stationary bike, and walks 3-4 times a week. She generally feels well. She reports sleeping well. She does have additional problems to discuss today.  HPI  Knee Pain/Stiffness: Reports that this is a chronic problem. She saw Dr. Jefm Bryant in [place] about 3-5 years ago. She was told that her knees were bone on bone at that time. She would like a referral placed.  Flu Vaccine: patient gets flu vaccine for free at work.  History reviewed. No pertinent past medical history. Past Surgical History:  Procedure Laterality Date  . COLONOSCOPY WITH PROPOFOL N/A 12/26/2015   Procedure: COLONOSCOPY WITH PROPOFOL;  Surgeon: Lucilla Lame, MD;  Location: [place];  Service: Endoscopy;  Laterality: N/A;  . NO PAST SURGERIES    . POLYPECTOMY  12/26/2015   Procedure: POLYPECTOMY;  Surgeon: Lucilla Lame, MD;  Location: [place] [facility];  Service: Endoscopy;;   Social History   Socioeconomic History  . Marital status: Married    Spouse name: Not on file  . Number of children: Not on file  . Years of education: Not on file  . Highest education level: Not on file  Occupational History  . Not on file  Tobacco Use  . Smoking status: Former Research scientist (life sciences)  . Smokeless tobacco: Never Used  Vaping Use  . Vaping Use: Never used  Substance and Sexual Activity  . Alcohol use: Yes    Alcohol/week: 0.0 standard drinks    Comment: OCCASIONALLY 1-2 TIMES A MONTH  . Drug use: No  . Sexual activity: Not on file  Other Topics Concern  . Not on file  Social History  Narrative  . Not on file   Social Determinants of Health   Financial Resource Strain:   . Difficulty of Paying Living Expenses: Not on file  Food Insecurity:   . Worried About Charity fundraiser in the Last Year: Not on file  . Ran Out of Food in the Last Year: Not on file  Transportation Needs:   . Lack of Transportation (Medical): Not on file  . Lack of Transportation (Non-Medical): Not on file  Physical Activity:   . Days of Exercise per Week: Not on file  . Minutes of Exercise per Session: Not on file  Stress:   . Feeling of Stress : Not on file  Social Connections:   . Frequency of Communication with Friends and Family: Not on file  . Frequency of Social Gatherings with Friends and Family: Not on file  . Attends Religious Services: Not on file  . Active Member of Clubs or Organizations: Not on file  . Attends Archivist Meetings: Not on file  . Marital Status: Not on file  Intimate Partner Violence:   . Fear of Current or Ex-Partner: Not on file  . Emotionally Abused: Not on file  . Physically Abused: Not on file  . Sexually Abused: Not on file   Family Status  Relation Name Status  . Mother  Alive  . Father  Deceased at age 44  .  Sister  Alive  . Brother  Alive  . Son  Alive  . Sister  Alive  . Brother  Alive   Family History  Problem Relation Age of Onset  . Diabetes Mother   . Heart attack Father   . Diabetes Sister   . Hypertension Brother   . Cancer Sister    No Known Allergies  Patient Care Team: Rubye Beach as PCP - General (Physician Assistant)   Medications: Outpatient Medications Prior to Visit  Medication Sig  . aspirin 81 MG tablet Take 81 mg by mouth daily.  . Cholecalciferol (VITAMIN D3) 1000 units CAPS Take by mouth.  . Glucosamine-Chondroitin (OSTEO BI-FLEX REGULAR STRENGTH PO) Take by mouth.  . MULTIPLE VITAMINS-MINERALS PO Take 1 tablet by mouth daily.  . OMEGA-3 FATTY ACIDS PO Take 2 capsules by mouth daily.   . Psyllium (METAMUCIL MULTIHEALTH FIBER) 55.46 % POWD Take by mouth.    No facility-administered medications prior to visit.    Review of Systems  Constitutional: Negative.   HENT: Negative.   Eyes: Negative.   Respiratory: Negative.   Cardiovascular: Negative.   Gastrointestinal: Negative.   Endocrine: Negative.   Genitourinary: Negative.   Musculoskeletal: Positive for arthralgias.  Skin: Negative.   Allergic/Immunologic: Negative.   Neurological: Negative.   Hematological: Negative.   Psychiatric/Behavioral: Negative.     Last CBC Lab Results  Component Value Date   WBC 6.6 07/14/2020   HGB 14.3 07/14/2020   HCT 41.5 07/14/2020   MCV 96 07/14/2020   MCH 32.9 07/14/2020   RDW 13.1 07/14/2020   PLT 226 21/30/8657   Last metabolic panel Lab Results  Component Value Date   GLUCOSE 75 07/14/2020   NA 140 07/14/2020   K 4.2 07/14/2020   CL 103 07/14/2020   CO2 23 07/14/2020   BUN 12 07/14/2020   CREATININE 0.64 07/14/2020   GFRNONAA 101 07/14/2020   GFRAA 116 07/14/2020   CALCIUM 9.2 07/14/2020   PROT 6.2 07/14/2020   ALBUMIN 4.0 07/14/2020   LABGLOB 2.2 07/14/2020   AGRATIO 1.8 07/14/2020   BILITOT 0.4 07/14/2020   ALKPHOS 63 07/14/2020   AST 30 07/14/2020   ALT 43 (H) 07/14/2020      Objective    BP 94/60 (BP Location: Right Arm, Patient Position: Sitting, Cuff Size: Large)   Pulse (!) 105   Temp 99.1 F (37.3 C) (Oral)   Resp 16   Ht 5\' 2"  (1.575 m)   Wt 209 lb 3.2 oz (94.9 kg)   BMI 38.26 kg/m  BP Readings from Last 3 Encounters:  07/14/20 94/60  04/05/19 113/77  11/22/17 110/70   Wt Readings from Last 3 Encounters:  07/14/20 209 lb 3.2 oz (94.9 kg)  04/05/19 211 lb 6.4 oz (95.9 kg)  11/22/17 210 lb (95.3 kg)      Physical Exam Vitals reviewed.  Constitutional:      General: She is not in acute distress.    Appearance: Normal appearance. She is well-developed. She is obese. She is not ill-appearing or diaphoretic.  HENT:      Head: Normocephalic and atraumatic.     Right Ear: Tympanic membrane, ear canal and external ear normal.     Left Ear: Tympanic membrane, ear canal and external ear normal.  Eyes:     General: No scleral icterus.       Right eye: No discharge.        Left eye: No discharge.     Extraocular  Movements: Extraocular movements intact.     Conjunctiva/sclera: Conjunctivae normal.     Pupils: Pupils are equal, round, and reactive to light.  Neck:     Thyroid: No thyromegaly.     Vascular: No carotid bruit or JVD.     Trachea: No tracheal deviation.  Cardiovascular:     Rate and Rhythm: Normal rate and regular rhythm.     Pulses: Normal pulses.     Heart sounds: Normal heart sounds. No murmur heard.  No friction rub. No gallop.   Pulmonary:     Effort: Pulmonary effort is normal. No respiratory distress.     Breath sounds: Normal breath sounds. No wheezing or rales.  Chest:     Chest wall: No tenderness.  Abdominal:     General: Abdomen is flat. Bowel sounds are normal. There is no distension.     Palpations: Abdomen is soft. There is no mass.     Tenderness: There is no abdominal tenderness. There is no guarding or rebound.  Musculoskeletal:        General: No tenderness. Normal range of motion.     Cervical back: Normal range of motion and neck supple.     Right lower leg: No edema.     Left lower leg: No edema.  Lymphadenopathy:     Cervical: No cervical adenopathy.  Skin:    General: Skin is warm and dry.     Capillary Refill: Capillary refill takes less than 2 seconds.     Findings: No rash.  Neurological:     General: No focal deficit present.     Mental Status: She is alert and oriented to person, place, and time. Mental status is at baseline.  Psychiatric:        Mood and Affect: Mood normal.        Behavior: Behavior normal.        Thought Content: Thought content normal.        Judgment: Judgment normal.      Last depression screening scores PHQ 2/9 Scores  07/14/2020 04/05/2019 11/22/2017  PHQ - 2 Score 0 0 0  PHQ- 9 Score - - 0   Last fall risk screening Fall Risk  07/14/2020  Falls in the past year? 0  Number falls in past yr: 0  Injury with Fall? 0  Risk for fall due to : No Fall Risks  Follow up Falls evaluation completed   Last Audit-C alcohol use screening Alcohol Use Disorder Test (AUDIT) 07/14/2020  1. How often do you have a drink containing alcohol? 0  2. How many drinks containing alcohol do you have on a typical day when you are drinking? 0  3. How often do you have six or more drinks on one occasion? 0  AUDIT-C Score 0  Alcohol Brief Interventions/Follow-up AUDIT Score <7 follow-up not indicated   A score of 3 or more in women, and 4 or more in men indicates increased risk for alcohol abuse, EXCEPT if all of the points are from question 1   Results for orders placed or performed in visit on 07/14/20  CBC with Differential/Platelet  Result Value Ref Range   WBC 6.6 3.4 - 10.8 x10E3/uL   RBC 4.34 3.77 - 5.28 x10E6/uL   Hemoglobin 14.3 11.1 - 15.9 g/dL   Hematocrit 41.5 34.0 - 46.6 %   MCV 96 79 - 97 fL   MCH 32.9 26.6 - 33.0 pg   MCHC 34.5 31 - 35 g/dL  RDW 13.1 11.7 - 15.4 %   Platelets 226 150 - 450 x10E3/uL   Neutrophils 54 Not Estab. %   Lymphs 33 Not Estab. %   Monocytes 10 Not Estab. %   Eos 2 Not Estab. %   Basos 1 Not Estab. %   Neutrophils Absolute 3.6 1 - 7 x10E3/uL   Lymphocytes Absolute 2.2 0 - 3 x10E3/uL   Monocytes Absolute 0.7 0 - 0 x10E3/uL   EOS (ABSOLUTE) 0.1 0.0 - 0.4 x10E3/uL   Basophils Absolute 0.0 0 - 0 x10E3/uL   Immature Granulocytes 0 Not Estab. %   Immature Grans (Abs) 0.0 0.0 - 0.1 x10E3/uL  Comprehensive metabolic panel  Result Value Ref Range   Glucose 75 65 - 99 mg/dL   BUN 12 6 - 24 mg/dL   Creatinine, Ser 0.64 0.57 - 1.00 mg/dL   GFR calc non Af Amer 101 >59 mL/min/1.73   GFR calc Af Amer 116 >59 mL/min/1.73   BUN/Creatinine Ratio 19 9 - 23   Sodium 140 134 - 144 mmol/L    Potassium 4.2 3.5 - 5.2 mmol/L   Chloride 103 96 - 106 mmol/L   CO2 23 20 - 29 mmol/L   Calcium 9.2 8.7 - 10.2 mg/dL   Total Protein 6.2 6.0 - 8.5 g/dL   Albumin 4.0 3.8 - 4.9 g/dL   Globulin, Total 2.2 1.5 - 4.5 g/dL   Albumin/Globulin Ratio 1.8 1.2 - 2.2   Bilirubin Total 0.4 0.0 - 1.2 mg/dL   Alkaline Phosphatase 63 44 - 121 IU/L   AST 30 0 - 40 IU/L   ALT 43 (H) 0 - 32 IU/L  Hemoglobin A1c  Result Value Ref Range   Hgb A1c MFr Bld 5.8 (H) 4.8 - 5.6 %   Est. average glucose Bld gHb Est-mCnc 120 mg/dL  Lipid panel  Result Value Ref Range   Cholesterol, Total 180 100 - 199 mg/dL   Triglycerides 178 (H) 0 - 149 mg/dL   HDL 49 >39 mg/dL   VLDL Cholesterol Cal 31 5 - 40 mg/dL   LDL Chol Calc (NIH) 100 (H) 0 - 99 mg/dL   Chol/HDL Ratio 3.7 0.0 - 4.4 ratio  TSH  Result Value Ref Range   TSH 0.522 0.450 - 4.500 uIU/mL  Hepatitis C Antibody  Result Value Ref Range   Hep C Virus Ab <0.1 0.0 - 0.9 s/co ratio  Vitamin D (25 hydroxy)  Result Value Ref Range   Vit D, 25-Hydroxy 47.3 30.0 - 100.0 ng/mL    Assessment & Plan    Routine Health Maintenance and Physical Exam  Exercise Activities and Dietary recommendations Goals   None     Immunization History  Administered Date(s) Administered  . Influenza-Unspecified 09/20/2017  . Moderna SARS-COVID-2 Vaccination 11/12/2019, 12/10/2019  . Td 07/07/1994, 06/27/2006, 11/22/2017  . Tdap 06/27/2006    Health Maintenance  Topic Date Due  . INFLUENZA VACCINE  01/29/2021 (Originally 06/01/2020)  . MAMMOGRAM  12/10/2020  . COLONOSCOPY  12/25/2020  . PAP SMEAR-Modifier  04/04/2022  . TETANUS/TDAP  11/23/2027  . COVID-19 Vaccine  Completed  . Hepatitis C Screening  Completed  . HIV Screening  Completed    Discussed health benefits of physical activity, and encouraged her to engage in regular exercise appropriate for her age and condition.  1. Encounter for annual physical exam Normal physical exam today. Will check labs as  below and f/u pending lab results. If labs are stable and WNL she will not need  to have these rechecked for one year at her next annual physical exam. She is to call the office in the meantime if she has any acute issue, questions or concerns. - CBC with Differential/Platelet - Comprehensive metabolic panel - Hemoglobin A1c - TSH  2. Borderline diabetes Diet controlled. Will check labs as below and f/u pending results. - Hemoglobin A1c  3. Hypercholesterolemia with hypertriglyceridemia Diet controlled. Will check labs as below and f/u pending results. - Lipid panel  4. Encounter for hepatitis C screening test for low risk patient Will check labs as below and f/u pending results. - Hepatitis C Antibody  5. Avitaminosis D On OTC Vit D 1000IU daily. Will check labs as below and f/u pending results. - Vitamin D (25 hydroxy)  6. Bilateral primary osteoarthritis of knee Worsening. Continue ibuprofen with food. Call if worsening before seen.  - Ambulatory referral to Orthopedic Surgery  7. Class 2 severe obesity due to excess calories with serious comorbidity and body mass index (BMI) of 38.0 to 38.9 in adult [facility]) Counseled patient on healthy lifestyle modifications including dieting and exercise.   8. Yeast infection Reports symptoms of itching and thick, white vaginal discharge. Will treat with Terazol cream as below. Call if not improving.  - terconazole (TERAZOL 7) 0.4 % vaginal cream; Place 1 applicator vaginally at bedtime. X 7 days  Dispense: 45 g; Refill: 0   Return in about 1 year (around 07/14/2021).     Reynolds Bowl, PA-C, have reviewed all documentation for this visit. The documentation on 07/22/20 for the exam, diagnosis, procedures, and orders are all accurate and complete.   Rubye Beach  [facility] [phone] (phone) [phone] (fax)  [place]

## 2020-07-14 NOTE — Patient Instructions (Signed)

## 2020-07-15 LAB — CBC WITH DIFFERENTIAL/PLATELET
Basophils Absolute: 0 10*3/uL (ref 0.0–0.2)
Basos: 1 %
EOS (ABSOLUTE): 0.1 10*3/uL (ref 0.0–0.4)
Eos: 2 %
Hematocrit: 41.5 % (ref 34.0–46.6)
Hemoglobin: 14.3 g/dL (ref 11.1–15.9)
Immature Grans (Abs): 0 10*3/uL (ref 0.0–0.1)
Immature Granulocytes: 0 %
Lymphocytes Absolute: 2.2 10*3/uL (ref 0.7–3.1)
Lymphs: 33 %
MCH: 32.9 pg (ref 26.6–33.0)
MCHC: 34.5 g/dL (ref 31.5–35.7)
MCV: 96 fL (ref 79–97)
Monocytes Absolute: 0.7 10*3/uL (ref 0.1–0.9)
Monocytes: 10 %
Neutrophils Absolute: 3.6 10*3/uL (ref 1.4–7.0)
Neutrophils: 54 %
Platelets: 226 10*3/uL (ref 150–450)
RBC: 4.34 x10E6/uL (ref 3.77–5.28)
RDW: 13.1 % (ref 11.7–15.4)
WBC: 6.6 10*3/uL (ref 3.4–10.8)

## 2020-07-15 LAB — TSH: TSH: 0.522 u[IU]/mL (ref 0.450–4.500)

## 2020-07-15 LAB — COMPREHENSIVE METABOLIC PANEL
ALT: 43 IU/L — ABNORMAL HIGH (ref 0–32)
AST: 30 IU/L (ref 0–40)
Albumin/Globulin Ratio: 1.8 (ref 1.2–2.2)
Albumin: 4 g/dL (ref 3.8–4.9)
Alkaline Phosphatase: 63 IU/L (ref 44–121)
BUN/Creatinine Ratio: 19 (ref 9–23)
BUN: 12 mg/dL (ref 6–24)
Bilirubin Total: 0.4 mg/dL (ref 0.0–1.2)
CO2: 23 mmol/L (ref 20–29)
Calcium: 9.2 mg/dL (ref 8.7–10.2)
Chloride: 103 mmol/L (ref 96–106)
Creatinine, Ser: 0.64 mg/dL (ref 0.57–1.00)
GFR calc Af Amer: 116 mL/min/{1.73_m2} (ref >59)
GFR calc non Af Amer: 101 mL/min/{1.73_m2} (ref >59)
Globulin, Total: 2.2 g/dL (ref 1.5–4.5)
Glucose: 75 mg/dL (ref 65–99)
Potassium: 4.2 mmol/L (ref 3.5–5.2)
Sodium: 140 mmol/L (ref 134–144)
Total Protein: 6.2 g/dL (ref 6.0–8.5)

## 2020-07-15 LAB — VITAMIN D 25 HYDROXY (VIT D DEFICIENCY, FRACTURES): Vit D, 25-Hydroxy: 47.3 ng/mL (ref 30.0–100.0)

## 2020-07-15 LAB — LIPID PANEL
Chol/HDL Ratio: 3.7 ratio (ref 0.0–4.4)
Cholesterol, Total: 180 mg/dL (ref 100–199)
HDL: 49 mg/dL (ref >39)
LDL Chol Calc (NIH): 100 mg/dL — ABNORMAL HIGH (ref 0–99)
Triglycerides: 178 mg/dL — ABNORMAL HIGH (ref 0–149)
VLDL Cholesterol Cal: 31 mg/dL (ref 5–40)

## 2020-07-15 LAB — HEMOGLOBIN A1C
Est. average glucose Bld gHb Est-mCnc: 120 mg/dL
Hgb A1c MFr Bld: 5.8 % — ABNORMAL HIGH (ref 4.8–5.6)

## 2020-07-15 LAB — HEPATITIS C ANTIBODY: Hep C Virus Ab: <0.1 s/co ratio (ref 0.0–0.9)

## 2020-07-16 ENCOUNTER — Telehealth: Payer: Self-pay

## 2020-07-16 NOTE — Telephone Encounter (Signed)
Patient advised as directed below. 

## 2020-07-16 NOTE — Telephone Encounter (Signed)
-----   Message from Mar Daring, PA-C sent at 07/15/2020  3:28 PM EDT ----- Blood count is normal. Kidney function is normal. Liver enzymes have improved compared to last year. Sodium, potassium and calcium are normal. A1c/sugar is improved from 5.9 to 5.8. Cholesterol is normal and improved some from last year. Thyroid is normal. Hepatitis C screen is negative. Vit D is normal.

## 2020-07-16 NOTE — Telephone Encounter (Signed)
Patient returned call- notified of lab results- patient pleased.

## 2020-12-11 ENCOUNTER — Telehealth: Payer: Self-pay

## 2020-12-11 NOTE — Telephone Encounter (Signed)
LMTCB, [place] Triage Nurse may give patient message

## 2020-12-11 NOTE — Telephone Encounter (Signed)
Copied from [place] [id number]. Topic: General - Other >> Dec 11, 2020 10:08 AM Leward Quan A wrote: Reason for CRM: Patient called in asking Annette Bradford about getting the form for a handicapped placard for her car since she have issues with her knees and can not walk far distances. Need to know if she have to bring the form in or if its already at the office. Please advise Ph# [phone]

## 2020-12-11 NOTE — Telephone Encounter (Signed)
Form completed. She can come pick up at her convenience.

## 2020-12-12 LAB — HM MAMMOGRAPHY

## 2020-12-26 ENCOUNTER — Encounter: Payer: Self-pay | Admitting: Physician Assistant

## 2021-05-20 ENCOUNTER — Telehealth: Payer: Self-pay

## 2021-05-20 NOTE — Telephone Encounter (Signed)
Appt made

## 2021-05-20 NOTE — Telephone Encounter (Signed)
Copied from [place] [id number]. Topic: Appointment Scheduling - Scheduling Inquiry for Clinic >> May 20, 2021  2:27 PM Scherrie Gerlach wrote: Reason for CRM: pt needs a cpe after 07/14/21.  I could not find any appts. Please advise

## 2021-07-28 ENCOUNTER — Ambulatory Visit (INDEPENDENT_AMBULATORY_CARE_PROVIDER_SITE_OTHER): Payer: Commercial Managed Care - PPO | Admitting: Family Medicine

## 2021-07-28 ENCOUNTER — Other Ambulatory Visit: Payer: Self-pay

## 2021-07-28 ENCOUNTER — Encounter: Payer: Self-pay | Admitting: Family Medicine

## 2021-07-28 VITALS — BP 123/88 | HR 88 | Temp 98.9°F | Resp 16 | Ht 62.0 in | Wt 208.7 lb

## 2021-07-28 DIAGNOSIS — Z1211 Encounter for screening for malignant neoplasm of colon: Secondary | ICD-10-CM | POA: Diagnosis not present

## 2021-07-28 DIAGNOSIS — Z6838 Body mass index (BMI) 38.0-38.9, adult: Secondary | ICD-10-CM

## 2021-07-28 DIAGNOSIS — Z0000 Encounter for general adult medical examination without abnormal findings: Secondary | ICD-10-CM | POA: Diagnosis not present

## 2021-07-28 DIAGNOSIS — N898 Other specified noninflammatory disorders of vagina: Secondary | ICD-10-CM | POA: Insufficient documentation

## 2021-07-28 MED ORDER — TRIAMCINOLONE ACETONIDE 0.5 % EX OINT: 1.0000 "application " | TOPICAL_OINTMENT | Freq: Two times a day (BID) | CUTANEOUS | 0 refills | Status: DC

## 2021-07-28 NOTE — Assessment & Plan Note (Signed)
Discussed importance of healthy weight management Discussed diet and exercise  

## 2021-07-28 NOTE — Assessment & Plan Note (Signed)
Due for 5 year screening

## 2021-07-28 NOTE — Progress Notes (Signed)
Complete physical exam   Patient: Annette Bradford   DOB: 07-26-1966   55 y.o. Female  MRN: [id number] Visit Date: 07/28/2021  Today's healthcare provider: Gwyneth Sprout, FNP   Chief Complaint  Patient presents with   Annual Exam   Subjective    [name] is a 55 y.o. female who presents today for a complete physical exam.  She reports consuming a general diet. Gym/ health club routine includes cardio. She generally feels fairly well. She reports sleeping fairly well. She does have additional problems to discuss today.  HPI    Past Medical History:  Diagnosis Date   Abnormal liver enzymes 04/16/2015   Avitaminosis D 04/16/2015   Benign neoplasm of descending colon    Benign neoplasm of sigmoid colon    Bilateral primary osteoarthritis of knee 07/14/2020   Borderline diabetes 04/16/2015   Calcium blood increased 04/16/2015   Fibroid 04/16/2015   Hypercholesterolemia with hypertriglyceridemia 04/23/2016   Post menopausal syndrome 04/16/2015   Rectal polyp    Past Surgical History:  Procedure Laterality Date   COLONOSCOPY WITH PROPOFOL N/A 12/26/2015   Procedure: COLONOSCOPY WITH PROPOFOL;  Surgeon: Lucilla Lame, MD;  Location: [facility];  Service: Endoscopy;  Laterality: N/A;   NO PAST SURGERIES     POLYPECTOMY  12/26/2015   Procedure: POLYPECTOMY;  Surgeon: Lucilla Lame, MD;  Location: [facility];  Service: Endoscopy;;   Social History   Socioeconomic History   Marital status: Married    Spouse name: Not on file   Number of children: Not on file   Years of education: Not on file   Highest education level: Not on file  Occupational History   Not on file  Tobacco Use   Smoking status: Former   Smokeless tobacco: Never  Vaping Use   Vaping Use: Never used  Substance and Sexual Activity   Alcohol use: Yes    Alcohol/week: 0.0 standard drinks    Comment: OCCASIONALLY 1-2 TIMES A MONTH   Drug use: No   Sexual activity: Not on file   Other Topics Concern   Not on file  Social History Narrative   Not on file   Social Determinants of Health   Financial Resource Strain: Not on file  Food Insecurity: Not on file  Transportation Needs: Not on file  Physical Activity: Not on file  Stress: Not on file  Social Connections: Not on file  Intimate Partner Violence: Not on file   Family Status  Relation Name Status   Mother  Alive   Father  Deceased at age 25   Sister  53   Brother  Alive   Son  Alive   Sister  [place]   Family History  Problem Relation Age of Onset   Diabetes Mother    Heart attack Father    Diabetes Sister    Hypertension Brother    Cancer Sister    No Known Allergies  Patient Care Team: Gwyneth Sprout, FNP as PCP - General (Family Medicine)   Medications: Outpatient Medications Prior to Visit  Medication Sig   aspirin 81 MG tablet Take 81 mg by mouth daily.   Cholecalciferol (VITAMIN D3) 1000 units CAPS Take by mouth.   Glucosamine-Chondroitin (OSTEO BI-FLEX REGULAR STRENGTH PO) Take by mouth.   MULTIPLE VITAMINS-MINERALS PO Take 1 tablet by mouth daily.   OMEGA-3 FATTY ACIDS PO Take 2 capsules by mouth daily.   Psyllium 55.46 %  POWD Take by mouth.    terconazole (TERAZOL 7) 0.4 % vaginal cream Place 1 applicator vaginally at bedtime. X 7 days   No facility-administered medications prior to visit.    Review of Systems  Constitutional:  Negative for chills, fatigue and fever.  HENT:  Negative for congestion, ear pain, rhinorrhea, sneezing and sore throat.   Eyes: Negative.  Negative for pain and redness.  Respiratory:  Negative for cough, shortness of breath and wheezing.   Cardiovascular:  Negative for chest pain and leg swelling.  Gastrointestinal:  Negative for abdominal pain, blood in stool, constipation, diarrhea and nausea.  Endocrine: Negative for polydipsia and polyphagia.  Genitourinary: Negative.  Negative for dysuria, flank pain, hematuria, pelvic  pain, vaginal bleeding and vaginal discharge.       Vulvar itching  Musculoskeletal:  Negative for arthralgias, back pain, gait problem and joint swelling.  Skin:  Negative for rash.  Neurological: Negative.  Negative for dizziness, tremors, seizures, weakness, light-headedness, numbness and headaches.  Hematological:  Negative for adenopathy.  Psychiatric/Behavioral: Negative.  Negative for behavioral problems, confusion and dysphoric mood. The patient is not nervous/anxious and is not hyperactive.      Objective    BP 123/88 (BP Location: Left Arm, Patient Position: Sitting, Cuff Size: Large)   Pulse 88   Temp 98.9 F (37.2 C) (Oral)   Resp 16   Ht 5\' 2"  (1.575 m)   Wt 208 lb 11.2 oz (94.7 kg)   BMI 38.17 kg/m    Physical Exam Vitals and nursing note reviewed.  Constitutional:      General: She is awake. She is not in acute distress.    Appearance: Normal appearance. She is well-developed and well-groomed. She is obese. She is not ill-appearing, toxic-appearing or diaphoretic.  HENT:     Head: Normocephalic and atraumatic.     Jaw: There is normal jaw occlusion. No trismus, tenderness, swelling or pain on movement.     Right Ear: Hearing, tympanic membrane, ear canal and external ear normal. There is no impacted cerumen.     Left Ear: Hearing, tympanic membrane, ear canal and external ear normal. There is no impacted cerumen.     Nose: Nose normal. No congestion or rhinorrhea.     Right Turbinates: Not enlarged, swollen or pale.     Left Turbinates: Not enlarged, swollen or pale.     Right Sinus: No maxillary sinus tenderness or frontal sinus tenderness.     Left Sinus: No maxillary sinus tenderness or frontal sinus tenderness.     Mouth/Throat:     Lips: Pink.     Mouth: Mucous membranes are moist. No injury.     Tongue: No lesions.     Pharynx: Oropharynx is clear. Uvula midline. No pharyngeal swelling, oropharyngeal exudate, posterior oropharyngeal erythema or uvula  swelling.     Tonsils: No tonsillar exudate or tonsillar abscesses.  Eyes:     General: Lids are normal. Lids are everted, no foreign bodies appreciated. Vision grossly intact. Gaze aligned appropriately. No allergic shiner or visual field deficit.       Right eye: No discharge.        Left eye: No discharge.     Extraocular Movements: Extraocular movements intact.     Conjunctiva/sclera: Conjunctivae normal.     Right eye: Right conjunctiva is not injected. No exudate.    Left eye: Left conjunctiva is not injected. No exudate.    Pupils: Pupils are equal, round, and reactive to light.  Neck:     Thyroid: No thyroid mass, thyromegaly or thyroid tenderness.     Vascular: No carotid bruit.     Trachea: Trachea normal.  Cardiovascular:     Rate and Rhythm: Normal rate and regular rhythm.     Pulses: Normal pulses.          Carotid pulses are 2+ on the right side and 2+ on the left side.      Radial pulses are 2+ on the right side and 2+ on the left side.       Dorsalis pedis pulses are 2+ on the right side and 2+ on the left side.       Posterior tibial pulses are 2+ on the right side and 2+ on the left side.     Heart sounds: Normal heart sounds, S1 normal and S2 normal. No murmur heard.   No friction rub. No gallop.  Pulmonary:     Effort: Pulmonary effort is normal. No respiratory distress.     Breath sounds: Normal breath sounds and air entry. No stridor. No wheezing, rhonchi or rales.  Chest:     Chest wall: No tenderness.     Comments: Breast exam deferred; discussed 'know your lemons' campaign and self exam Abdominal:     General: Abdomen is flat. Bowel sounds are normal. There is no distension.     Palpations: Abdomen is soft. There is no mass.     Tenderness: There is no abdominal tenderness. There is no right CVA tenderness, left CVA tenderness, guarding or rebound.     Hernia: No hernia is present.  Genitourinary:    Comments: Exam deferred; complaints of external  itching Musculoskeletal:        General: No swelling, tenderness, deformity or signs of injury. Normal range of motion.     Cervical back: Full passive range of motion without pain, normal range of motion and neck supple. No edema, rigidity or tenderness. No muscular tenderness.     Right lower leg: No edema.     Left lower leg: No edema.  Lymphadenopathy:     Cervical: No cervical adenopathy.     Right cervical: No superficial, deep or posterior cervical adenopathy.    Left cervical: No superficial, deep or posterior cervical adenopathy.  Skin:    General: Skin is warm and dry.     Capillary Refill: Capillary refill takes less than 2 seconds.     Coloration: Skin is not jaundiced or pale.     Findings: No bruising, erythema, lesion or rash.     Comments: Cool lower extermities  Neurological:     General: No focal deficit present.     Mental Status: She is alert and oriented to person, place, and time. Mental status is at baseline.     GCS: GCS eye subscore is 4. GCS verbal subscore is 5. GCS motor subscore is 6.     Sensory: Sensation is intact. No sensory deficit.     Motor: Motor function is intact. No weakness.     Coordination: Coordination is intact. Coordination normal.     Gait: Gait is intact. Gait normal.  Psychiatric:        Attention and Perception: Attention and perception normal.        Mood and Affect: Mood and affect normal.        Speech: Speech normal.        Behavior: Behavior normal. Behavior is cooperative.        Thought  Content: Thought content normal.        Cognition and Memory: Cognition and memory normal.        Judgment: Judgment normal.     Last depression screening scores PHQ 2/9 Scores 07/28/2021 07/14/2020 04/05/2019  PHQ - 2 Score 0 0 0  PHQ- 9 Score 0 - -   Last fall risk screening Fall Risk  07/28/2021  Falls in the past year? 0  Number falls in past yr: 0  Injury with Fall? 0  Risk for fall due to : No Fall Risks  Follow up Falls evaluation  completed   Last Audit-C alcohol use screening Alcohol Use Disorder Test (AUDIT) 07/28/2021  1. How often do you have a drink containing alcohol? 1  2. How many drinks containing alcohol do you have on a typical day when you are drinking? 0  3. How often do you have six or more drinks on one occasion? 1  AUDIT-C Score 2  Alcohol Brief Interventions/Follow-up -   A score of 3 or more in women, and 4 or more in men indicates increased risk for alcohol abuse, EXCEPT if all of the points are from question 1   No results found for any visits on 07/28/21.  Assessment & Plan    Routine Health Maintenance and Physical Exam  Exercise Activities and Dietary recommendations  Goals   None     Immunization History  Administered Date(s) Administered   Influenza-Unspecified 09/20/2017   Moderna Sars-Covid-2 Vaccination 11/12/2019, 12/10/2019, 09/12/2020   Td 07/07/1994, 06/27/2006, 11/22/2017   Tdap 06/27/2006    Health Maintenance  Topic Date Due   Zoster Vaccines- Shingrix (1 of 2) Never done   COLONOSCOPY (Pts 45-32yrs Insurance coverage will need to be confirmed)  12/25/2020   COVID-19 Vaccine (4 - Booster for Moderna series) 01/10/2021   INFLUENZA VACCINE  01/29/2022 (Originally 06/01/2021)   MAMMOGRAM  12/12/2021   PAP SMEAR-Modifier  04/04/2022   TETANUS/TDAP  11/23/2027   Hepatitis C Screening  Completed   HIV Screening  Completed   HPV VACCINES  Aged Out    Discussed health benefits of physical activity, and encouraged her to engage in regular exercise appropriate for her age and condition.  Problem List Items Addressed This Visit       Musculoskeletal and Integument   Vaginal itching    Ongoing concern; trial of kenalog      Relevant Medications   triamcinolone ointment (KENALOG) 0.5 %     Other   Class 2 severe obesity due to excess calories with serious comorbidity and body mass index (BMI) of 38.0 to 38.9 in adult [facility])    Discussed importance of healthy weight  management Discussed diet and exercise       Annual physical exam - Primary    Things to do to keep yourself healthy  - Exercise at least 30-45 minutes a day, 3-4 days a week.  - Eat a low-fat diet with lots of fruits and vegetables, up to 7-9 servings per day.  - Seatbelts can save your life. Wear them always.  - Smoke detectors on every level of your home, check batteries every year.  - Eye Doctor - have an eye exam every 1-2 years  - Safe sex - if you may be exposed to STDs, use a condom.  - Alcohol -  If you drink, do it moderately, less than 2 drinks per day.  - [place]. Choose someone to speak for you if  you are not able.  - Depression is common in our stressful world.If you're feeling down or losing interest in things you normally enjoy, please come in for a visit.  - Violence - If anyone is threatening or hurting you, please call immediately.        Relevant Orders   CBC with Differential/Platelet   Comprehensive metabolic panel   Hemoglobin A1c   Lipid panel   TSH   Magnesium   Colon cancer screening    Due for 5 year screening      Relevant Orders   Ambulatory referral to Gastroenterology     Return in about 1 year (around 07/28/2022), or Pick up Debrox OTC for ear buildup.     Vonna Kotyk, FNP, have reviewed all documentation for this visit. The documentation on 07/28/21 for the exam, diagnosis, procedures, and orders are all accurate and complete.    Gwyneth Sprout, [place] [phone] (phone) [phone] (fax)  [place]

## 2021-07-28 NOTE — Assessment & Plan Note (Signed)

## 2021-07-28 NOTE — Assessment & Plan Note (Signed)
Ongoing concern; trial of kenalog

## 2021-07-29 ENCOUNTER — Other Ambulatory Visit (INDEPENDENT_AMBULATORY_CARE_PROVIDER_SITE_OTHER): Payer: Self-pay

## 2021-07-29 DIAGNOSIS — Z1211 Encounter for screening for malignant neoplasm of colon: Secondary | ICD-10-CM

## 2021-07-29 LAB — CBC WITH DIFFERENTIAL/PLATELET
Basophils Absolute: 0 10*3/uL (ref 0.0–0.2)
Basos: 0 %
EOS (ABSOLUTE): 0.1 10*3/uL (ref 0.0–0.4)
Eos: 1 %
Hematocrit: 41.8 % (ref 34.0–46.6)
Hemoglobin: 14.3 g/dL (ref 11.1–15.9)
Immature Grans (Abs): 0 10*3/uL (ref 0.0–0.1)
Immature Granulocytes: 0 %
Lymphocytes Absolute: 1.8 10*3/uL (ref 0.7–3.1)
Lymphs: 32 %
MCH: 32.1 pg (ref 26.6–33.0)
MCHC: 34.2 g/dL (ref 31.5–35.7)
MCV: 94 fL (ref 79–97)
Monocytes Absolute: 0.5 10*3/uL (ref 0.1–0.9)
Monocytes: 9 %
Neutrophils Absolute: 3.3 10*3/uL (ref 1.4–7.0)
Neutrophils: 58 %
Platelets: 244 10*3/uL (ref 150–450)
RBC: 4.46 x10E6/uL (ref 3.77–5.28)
RDW: 12.1 % (ref 11.7–15.4)
WBC: 5.7 10*3/uL (ref 3.4–10.8)

## 2021-07-29 LAB — COMPREHENSIVE METABOLIC PANEL
ALT: 42 IU/L — ABNORMAL HIGH (ref 0–32)
AST: 28 IU/L (ref 0–40)
Albumin/Globulin Ratio: 1.8 (ref 1.2–2.2)
Albumin: 4.6 g/dL (ref 3.8–4.9)
Alkaline Phosphatase: 80 IU/L (ref 44–121)
BUN/Creatinine Ratio: 18 (ref 9–23)
BUN: 12 mg/dL (ref 6–24)
Bilirubin Total: 0.3 mg/dL (ref 0.0–1.2)
CO2: 23 mmol/L (ref 20–29)
Calcium: 9.5 mg/dL (ref 8.7–10.2)
Chloride: 102 mmol/L (ref 96–106)
Creatinine, Ser: 0.68 mg/dL (ref 0.57–1.00)
Globulin, Total: 2.5 g/dL (ref 1.5–4.5)
Glucose: 85 mg/dL (ref 70–99)
Potassium: 4.5 mmol/L (ref 3.5–5.2)
Sodium: 142 mmol/L (ref 134–144)
Total Protein: 7.1 g/dL (ref 6.0–8.5)
eGFR: 102 mL/min/{1.73_m2} (ref >59)

## 2021-07-29 LAB — LIPID PANEL
Chol/HDL Ratio: 3.4 ratio (ref 0.0–4.4)
Cholesterol, Total: 220 mg/dL — ABNORMAL HIGH (ref 100–199)
HDL: 65 mg/dL (ref >39)
LDL Chol Calc (NIH): 125 mg/dL — ABNORMAL HIGH (ref 0–99)
Triglycerides: 174 mg/dL — ABNORMAL HIGH (ref 0–149)
VLDL Cholesterol Cal: 30 mg/dL (ref 5–40)

## 2021-07-29 LAB — MAGNESIUM: Magnesium: 1.9 mg/dL (ref 1.6–2.3)

## 2021-07-29 LAB — TSH: TSH: 0.685 u[IU]/mL (ref 0.450–4.500)

## 2021-07-29 LAB — HEMOGLOBIN A1C
Est. average glucose Bld gHb Est-mCnc: 128 mg/dL
Hgb A1c MFr Bld: 6.1 % — ABNORMAL HIGH (ref 4.8–5.6)

## 2021-07-29 MED ORDER — PEG 3350-KCL-NA BICARB-NACL 420 G PO SOLR: 4000.0000 mL | Freq: Once | ORAL | 0 refills | Status: AC

## 2021-07-29 NOTE — Progress Notes (Signed)
Gastroenterology Pre-Procedure Review  Request Date: 08/21/2021 Requesting Physician: Dr. Bonna Gains  PATIENT REVIEW QUESTIONS: The patient responded to the following health history questions as indicated:    1. Are you having any GI issues? no 2. Do you have a personal history of Polyps? no 3. Do you have a family history of Colon Cancer or Polyps? no 4. Diabetes Mellitus? no 5. Joint replacements in the past 12 months?no 6. Major health problems in the past 3 months?no 7. Any artificial heart valves, MVP, or defibrillator?no    MEDICATIONS & ALLERGIES:    Patient reports the following regarding taking any anticoagulation/antiplatelet therapy:   Plavix, Coumadin, Eliquis, Xarelto, Lovenox, Pradaxa, Brilinta, or Effient? no Aspirin? no  Patient confirms/reports the following medications:  Current Outpatient Medications  Medication Sig Dispense Refill   aspirin 81 MG tablet Take 81 mg by mouth daily.     Cholecalciferol (VITAMIN D3) 1000 units CAPS Take by mouth.     Glucosamine-Chondroitin (OSTEO BI-FLEX REGULAR STRENGTH PO) Take by mouth.     MULTIPLE VITAMINS-MINERALS PO Take 1 tablet by mouth daily.     OMEGA-3 FATTY ACIDS PO Take 2 capsules by mouth daily.     Psyllium 55.46 % POWD Take by mouth.      terconazole (TERAZOL 7) 0.4 % vaginal cream Place 1 applicator vaginally at bedtime. X 7 days 45 g 0   triamcinolone ointment (KENALOG) 0.5 % Apply 1 application topically 2 (two) times daily. 30 g 0   No current facility-administered medications for this visit.    Patient confirms/reports the following allergies:  No Known Allergies  No orders of the defined types were placed in this encounter.   AUTHORIZATION INFORMATION Primary Insurance: 1D#: Group #:  Secondary Insurance: 1D#: Group #:  SCHEDULE INFORMATION: Date: 08/21/2021 Time: Location: [facility]

## 2021-08-03 ENCOUNTER — Telehealth: Payer: Self-pay

## 2021-08-03 NOTE — Telephone Encounter (Signed)
Copied from [place] [phone]. Topic: General - Other >> Aug 03, 2021  1:14 PM [name], Annette Bradford wrote: Reason for CRM: Pt called back to go over her latest lab results, please advise.

## 2021-08-04 ENCOUNTER — Telehealth: Payer: Self-pay

## 2021-08-04 NOTE — Telephone Encounter (Signed)
Pt. Given lab results and instructions.Verbalizes understanding. 

## 2021-08-17 ENCOUNTER — Telehealth: Payer: Self-pay

## 2021-08-17 NOTE — Telephone Encounter (Signed)
Pt. Calling to reschedule colonoscopy

## 2021-08-18 NOTE — Telephone Encounter (Signed)
Patient has requested to reschedule procedure. Endo unit has been notified of change. Updated instructions will be sent.

## 2021-10-29 ENCOUNTER — Encounter: Admission: RE | Payer: Self-pay | Source: Home / Self Care

## 2021-10-29 ENCOUNTER — Ambulatory Visit
Admission: RE | Admit: 2021-10-29 | Payer: Commercial Managed Care - PPO | Source: Home / Self Care | Admitting: Gastroenterology

## 2021-10-29 SURGERY — COLONOSCOPY WITH PROPOFOL
Anesthesia: General

## 2021-12-14 LAB — HM MAMMOGRAPHY

## 2021-12-27 DIAGNOSIS — M1712 Unilateral primary osteoarthritis, left knee: Secondary | ICD-10-CM | POA: Insufficient documentation

## 2021-12-27 DIAGNOSIS — M1711 Unilateral primary osteoarthritis, right knee: Secondary | ICD-10-CM | POA: Insufficient documentation

## 2022-01-17 NOTE — Discharge Instructions (Signed)
Instructions after Total Knee Replacement   Aleynah Rocchio P. Islay Polanco, Jr., M.D.     Dept. of Orthopaedics & Sports Medicine  [facility]  [address]  [place], [place]  [address]  Phone: [phone]   Fax: [phone]    DIET: Drink plenty of non-alcoholic fluids. Resume your normal diet. Include foods high in fiber.  ACTIVITY:  You may use crutches or a walker with weight-bearing as tolerated, unless instructed otherwise. You may be weaned off of the walker or crutches by your Physical Therapist.  Do NOT place pillows under the knee. Anything placed under the knee could limit your ability to straighten the knee.   Continue doing gentle exercises. Exercising will reduce the pain and swelling, increase motion, and prevent muscle weakness.   Please continue to use the TED compression stockings for 6 weeks. You may remove the stockings at night, but should reapply them in the morning. Do not drive or operate any equipment until instructed.  WOUND CARE:  Continue to use the PolarCare or ice packs periodically to reduce pain and swelling. You may bathe or shower after the staples are removed at the first office visit following surgery.  MEDICATIONS: You may resume your regular medications. Please take the pain medication as prescribed on the medication. Do not take pain medication on an empty stomach. You have been given a prescription for a blood thinner (Lovenox or Coumadin). Please take the medication as instructed. (NOTE: After completing a 2 week course of Lovenox, take one Enteric-coated aspirin once a day. This along with elevation will help reduce the possibility of phlebitis in your operated leg.) Do not drive or drink alcoholic beverages when taking pain medications.  CALL THE OFFICE FOR: Temperature above 101 degrees Excessive bleeding or drainage on the dressing. Excessive swelling, coldness, or paleness of the toes. Persistent nausea and vomiting.  FOLLOW-UP:  You  should have an appointment to return to the office in 10-14 days after surgery. Arrangements have been made for continuation of Physical Therapy (either home therapy or outpatient therapy).   [facility] Department Directory         [email or web address]       [email or web address]          Cardiology  Appointments: [place] - [phone] [place] - [phone]  Endocrinology  Appointments: [place] - [phone] [place] - [phone]  Gastroenterology  Appointments: [place] - [phone] [place] - [phone]        General Surgery   Appointments: [place] - [phone]  Internal Medicine/Family Medicine  Appointments: [place] - [phone] [place] - [phone] [place] - [phone]  Metabolic and Weigh Loss Surgery  Appointments: [place] - [phone]        Neurology  Appointments: [place] - [phone] [place] - [phone]  Neurosurgery  Appointments: [place] - [phone]  Obstetrics & Gynecology  Appointments: [place] - [phone] [place] - [phone]        Pediatrics  Appointments: [place] - [phone] [place] - [phone]  Physiatry  Appointments: [place] -[phone]  Physical Therapy  Appointments: [place] - [phone] [place] - [phone]        Podiatry  Appointments: [place] - [phone] [place] - [phone]  Pulmonology  Appointments: [place] - [phone]  Rheumatology  Appointments: [place] - [phone]        [place] Location: [facility]  [address] [place], [place]  [address]  [place] Location: [facility] [address] [place], [place]  [address]  [place] Location: [facility] [address] [place],   [address]    

## 2022-01-28 ENCOUNTER — Encounter
Admission: RE | Admit: 2022-01-28 | Discharge: 2022-01-28 | Disposition: A | Discharge status: Home / Self Care | Payer: Commercial Managed Care - PPO | Source: Ambulatory Visit | Attending: Orthopedic Surgery | Admitting: Orthopedic Surgery

## 2022-01-28 ENCOUNTER — Other Ambulatory Visit: Payer: Commercial Managed Care - PPO

## 2022-01-28 VITALS — BP 111/70 | HR 83 | Resp 16 | Ht 62.0 in | Wt 212.3 lb

## 2022-01-28 DIAGNOSIS — Z22322 Carrier or suspected carrier of Methicillin resistant Staphylococcus aureus: Secondary | ICD-10-CM

## 2022-01-28 DIAGNOSIS — Z01818 Encounter for other preprocedural examination: Secondary | ICD-10-CM | POA: Diagnosis present

## 2022-01-28 DIAGNOSIS — R7303 Prediabetes: Secondary | ICD-10-CM | POA: Insufficient documentation

## 2022-01-28 DIAGNOSIS — M1712 Unilateral primary osteoarthritis, left knee: Secondary | ICD-10-CM | POA: Diagnosis not present

## 2022-01-28 HISTORY — DX: Prediabetes: R73.03

## 2022-01-28 HISTORY — DX: Carrier or suspected carrier of methicillin resistant Staphylococcus aureus: Z22.322

## 2022-01-28 HISTORY — DX: Obesity, unspecified: E66.9

## 2022-01-28 LAB — URINALYSIS, ROUTINE W REFLEX MICROSCOPIC
Bacteria, UA: NONE SEEN
Bilirubin Urine: NEGATIVE
Glucose, UA: NEGATIVE mg/dL
Ketones, ur: NEGATIVE mg/dL
Leukocytes,Ua: NEGATIVE
Nitrite: NEGATIVE
Protein, ur: NEGATIVE mg/dL
Specific Gravity, Urine: 1.021 (ref 1.005–1.030)
pH: 5 (ref 5.0–8.0)

## 2022-01-28 LAB — COMPREHENSIVE METABOLIC PANEL
ALT: 30 U/L (ref 0–44)
AST: 31 U/L (ref 15–41)
Albumin: 4.4 g/dL (ref 3.5–5.0)
Alkaline Phosphatase: 60 U/L (ref 38–126)
Anion gap: 9 (ref 5–15)
BUN: 20 mg/dL (ref 6–20)
CO2: 27 mmol/L (ref 22–32)
Calcium: 9.4 mg/dL (ref 8.9–10.3)
Chloride: 102 mmol/L (ref 98–111)
Creatinine, Ser: 0.62 mg/dL (ref 0.44–1.00)
GFR, Estimated: >60 mL/min (ref >60)
Glucose, Bld: 87 mg/dL (ref 70–99)
Potassium: 3.9 mmol/L (ref 3.5–5.1)
Sodium: 138 mmol/L (ref 135–145)
Total Bilirubin: 0.5 mg/dL (ref 0.3–1.2)
Total Protein: 7.9 g/dL (ref 6.5–8.1)

## 2022-01-28 LAB — CBC
HCT: 41 % (ref 36.0–46.0)
Hemoglobin: 13.9 g/dL (ref 12.0–15.0)
MCH: 32.2 pg (ref 26.0–34.0)
MCHC: 33.9 g/dL (ref 30.0–36.0)
MCV: 94.9 fL (ref 80.0–100.0)
Platelets: 226 10*3/uL (ref 150–400)
RBC: 4.32 MIL/uL (ref 3.87–5.11)
RDW: 13 % (ref 11.5–15.5)
WBC: 6.4 10*3/uL (ref 4.0–10.5)
nRBC: 0 % (ref 0.0–0.2)

## 2022-01-28 LAB — SEDIMENTATION RATE: Sed Rate: 27 mm/hr (ref 0–30)

## 2022-01-28 LAB — TYPE AND SCREEN
ABO/RH(D): O POS
Antibody Screen: NEGATIVE

## 2022-01-28 LAB — HEMOGLOBIN A1C
Hgb A1c MFr Bld: 5.8 % — ABNORMAL HIGH (ref 4.8–5.6)
Mean Plasma Glucose: 119.76 mg/dL

## 2022-01-28 LAB — C-REACTIVE PROTEIN: CRP: 1.2 mg/dL — ABNORMAL HIGH (ref <1.0)

## 2022-01-28 LAB — SURGICAL PCR SCREEN
MRSA, PCR: POSITIVE — AB
Staphylococcus aureus: POSITIVE — AB

## 2022-01-28 NOTE — Progress Notes (Signed)
?  Perioperative Services ? ?Abnormal Lab Notification ?Date: 01/28/22 ? ?Name: Annette Bradford ?MRN:   [id number] ? ?Re: Abnormal labs noted during PAT appointment ? ?Provider Notified: Dereck Leep, MD ?Notification mode: Routed and/or faxed via [facility] ? ?Labs of concern: ?Lab Results  ?Component Value Date  ? STAPHAUREUS POSITIVE (A) 01/28/2022  ? MRSAPCR POSITIVE (A) 01/28/2022  ?  ?Notes: Patient is scheduled for a COMPUTER ASSISTED TOTAL KNEE ARTHROPLASTY (Left: Knee) on 02/10/2022. She is scheduled to receive CEFAZOLIN pre-operatively. Surgical PCR (+) for MRSA; see above. ? ?PLANS:  ?Annette Bradford is scheduled for the above procedure with Dr. Skip Estimable, MD.  Preoperative testing revealed a (+) MRSA PCR screen. Patient will need additional preoperative antimicrobial coverage for this pathogen.  Sending result to primary attending surgeon for review.  Surgeon to place further orders as deemed appropriate for this case. ? ?This is a Community education officer; no formal response is required. ? ?Honor Loh, MSN, APRN, FNP-C, CEN ?[place]  ?Peri-operative Services Nurse Practitioner ?Phone: [phone] ?01/28/22 1:16 PM ?

## 2022-01-28 NOTE — Patient Instructions (Addendum)
Your procedure is scheduled on:02-10-22 Wednesday ?Report to the Registration Desk on the 1st floor of the [facility].Then proceed to the 2nd floor Surgery Desk in the [facility] ?To find out your arrival time, please call [phone] between 1PM - 3PM on:02-09-22 Tuesday ? ?REMEMBER: ?Instructions that are not followed completely may result in serious medical risk, up to and including death; or upon the discretion of your surgeon and anesthesiologist your surgery may need to be rescheduled. ? ?Do not eat food after midnight the night before surgery.  ?No gum chewing, lozengers or hard candies. ? ?You may however, drink CLEAR liquids up to 2 hours before you are scheduled to arrive for your surgery. Do not drink anything within 2 hours of your scheduled arrival time. ? ?Clear liquids include: ?- water  ?- apple juice without pulp ?- gatorade (not RED colors) ?- black coffee or tea (Do NOT add milk or creamers to the coffee or tea) ?Do NOT drink anything that is not on this list. ? ?In addition, your doctor has ordered for you to drink the provided  ?Ensure Pre-Surgery Clear Carbohydrate Drink  ?Drinking this carbohydrate drink up to two hours before surgery helps to reduce insulin resistance and improve patient outcomes. Please complete drinking 2 hours prior to scheduled arrival time. ? ?Do NOT take any medication the day of surgery ? ?Stop your aspirin 81 MG 7 days prior to surgery-Last dose on 02-02-22  ? ?One week prior to surgery: ?Stop Anti-inflammatories (NSAIDS) such as Advil, Aleve, Ibuprofen, Motrin, Naproxen, Naprosyn and Aspirin based products such as Excedrin, Goodys Powder, BC Powder.You may however,  take Tylenol if needed for pain up until the day of surgery. ? ?Stop ANY OVER THE COUNTER supplements/vitamins 7 days prior to surgery -Glucosamine-Chondroitin (OSTEO BI-FLEX)  ? ?No Alcohol for 24 hours before or after surgery. ? ?No Smoking including e-cigarettes for 24 hours prior to surgery.  ?No  chewable tobacco products for at least 6 hours prior to surgery.  ?No nicotine patches on the day of surgery. ? ?Do not use any "recreational" drugs for at least a week prior to your surgery.  ?Please be advised that the combination of cocaine and anesthesia may have negative outcomes, up to and including death. ?If you test positive for cocaine, your surgery will be cancelled. ? ?On the morning of surgery brush your teeth with toothpaste and water, you may rinse your mouth with mouthwash if you wish. ?Do not swallow any toothpaste or mouthwash. ? ?Use CHG Soap as directed on instruction sheet. ? ?Do not wear jewelry, make-up, hairpins, clips or nail polish. ? ?Do not wear lotions, powders, or perfumes.  ? ?Do not shave body from the neck down 48 hours prior to surgery just in case you cut yourself which could leave a site for infection.  ?Also, freshly shaved skin may become irritated if using the CHG soap. ? ?Contact lenses, hearing aids and dentures may not be worn into surgery. ? ?Do not bring valuables to the hospital. [facility] is not responsible for any missing/lost belongings or valuables.  ? ?Notify your doctor if there is any change in your medical condition (cold, fever, infection). ? ?Wear comfortable clothing (specific to your surgery type) to the hospital. ? ?After surgery, you can help prevent lung complications by doing breathing exercises.  ?Take deep breaths and cough every 1-2 hours. Your doctor may order a device called an Incentive Spirometer to help you take deep breaths. ?When coughing or  sneezing, hold a pillow firmly against your incision with both hands. This is called ?splinting.? Doing this helps protect your incision. It also decreases belly discomfort. ? ?If you are being admitted to the hospital overnight, leave your suitcase in the car. ?After surgery it may be brought to your room. ? ?If you are being discharged the day of surgery, you will not be allowed to drive home. ?You will  need a responsible adult (18 years or older) to drive you home and stay with you that night.  ? ?If you are taking public transportation, you will need to have a responsible adult (18 years or older) with you. ?Please confirm with your physician that it is acceptable to use public transportation.  ? ?Please call the [facility] Dept. at [phone] if you have any questions about these instructions. ? ?Surgery Visitation Policy: ? ?Patients undergoing a surgery or procedure may have two family members or support persons with them as long as the person is not COVID-19 positive or experiencing its symptoms.  ? ?Inpatient Visitation:   ? ?Visiting hours are 7 a.m. to 8 p.m. ?Up to four visitors are allowed at one time in a patient room, including children. The visitors may rotate out with other people during the day. One designated support person (adult) may remain overnight.  ?

## 2022-02-07 ENCOUNTER — Encounter: Payer: Self-pay | Admitting: Orthopedic Surgery

## 2022-02-07 NOTE — H&P (Signed)
ORTHOPAEDIC HISTORY & PHYSICAL ?Tamala Julian [place], [place] - 02/02/2022 9:45 AM EDT ?Formatting of this note is different from the original. ?[facility]IC[facility] ?Chief Complaint:  ? ?Chief Complaint  ?Patient presents with  ? Knee Pain  ?H & P LEFT KNEE  ? ?History of Present Illness:  ? ?Annette Bradford is a 56 y.o. female that presents to clinic today for her preoperative history and evaluation. Patient presents unaccompanied. The patient is scheduled to undergo a left total knee arthroplasty on 02/10/22 by Dr. Marry Guan. Her pain began several years ago. The pain is located primarily along the medial aspect of the knee. She describes her pain as worse with weightbearing. She reports associated swelling with some giving way of the knee. She denies associated numbness or tingling, denies locking.  ? ?The patient's symptoms have progressed to the point that they decrease her quality of life. The patient has previously undergone conservative treatment including NSAIDS and injections to the knee without adequate control of her symptoms. ? ?Denies history of DVT, lumbar surgery, or significant cardiac history. No known drug allergies. ? ?Past Medical, Surgical, Family, Social History, Allergies, Medications:  ? ?Past Medical History:  ?Past Medical History:  ?Diagnosis Date  ? Avitaminosis A  ? Borderline diabetes  ? Carpal tunnel syndrome, bilateral  ? Chronic joint pain  ? Hypercholesterolemia with hypertriglyceridemia  ? ?Past Surgical History:  ?Past Surgical History:  ?Procedure Laterality Date  ? Colonoscopy with polypectomy 2017  ? ?Current Medications:  ?Current Outpatient Medications  ?Medication Sig Dispense Refill  ? glucosamine/chondr su A sod (OSTEO BI-FLEX ORAL) Take 1 tablet by mouth 2 (two) times daily  ? multivitamin/iron/folic acid (CENTRUM COMPLETE ORAL) Take 1 tablet by mouth once daily  ? naphazoline-pheniramine (NAPHCON-A) 0.025-0.3 % ophthalmic solution Place 1  drop into both eyes once a week  ? ?No current facility-administered medications for this visit.  ? ?Allergies: No Known Allergies ? ?Social History:  ?Social History  ? ?Socioeconomic History  ? Marital status: Married  ?Tobacco Use  ? Smoking status: Never  ? Smokeless tobacco: Never  ?Substance and Sexual Activity  ? Alcohol use: Yes  ? Drug use: Never  ? Sexual activity: Yes  ?Partners: Male  ? ?Family History:  ?Family History  ?Problem Relation Age of Onset  ? Rheum arthritis Mother  ? ?Review of Systems:  ? ?A 10+ ROS was performed, reviewed, and the pertinent orthopaedic findings are documented in the HPI.  ? ?Physical Examination:  ? ?BP (!) 142/98 (BP Location: Left upper arm, Patient Position: Sitting, BP Cuff Size: Large Adult)  Ht 157.5 cm ('5\' 2"'$ )  Wt 96.4 kg (212 lb 9.6 oz)  BMI 38.89 kg/m?  ? ?Patient is a well-developed, well-nourished female in no acute distress. Patient has normal mood and affect. Patient is alert and oriented to person, place, and time.  ? ?HEENT: Atraumatic, normocephalic. Pupils equal and reactive to light. Extraocular motion intact. Noninjected sclera. ? ?Cardiovascular: Regular rate and rhythm, with no murmurs, rubs, or gallops. Distal pulses palpable. No carotid bruits. ? ?Respiratory: Lungs clear to auscultation bilaterally.  ? ?Right Knee: ?Soft tissue swelling: mild ?Effusion: none ?Erythema: none ?Crepitance: mild ?Tenderness: medial ?Alignment: relative varus ?Mediolateral laxity: medial pseudolaxity ?Posterior sag: negative ?Patellar tracking: Good tracking without evidence of subluxation or tilt ?Atrophy: No significant atrophy.  ?Quadriceps tone was fair to good. ?Range of motion: 0/10/90 degrees  ? ?Able to plantarflex and dorsiflex the left ankle. Able to flex  and extend the toes. ? ?Sensation intact over the saphenous, lateral sural cutaneous, superficial fibular, and deep fibular nerve distributions. ? ?Tests Performed/Reviewed:  ?X-rays ? ?3 views of the left  knee were reviewed. Images reveal severe loss of medial compartment joint space with bone-on-bone contact and osteophyte formation. No fractures or dislocations. No other osseous abnormality noted ? ?Impression:  ? ?ICD-10-CM  ?1. Primary osteoarthritis of left knee M17.12  ? ?Plan:  ? ?The patient has end-stage degenerative changes of the left knee. It was explained to the patient that the condition is progressive in nature. Having failed conservative treatment, the patient has elected to proceed with a total joint arthroplasty. The patient will undergo a total joint arthroplasty with Dr. Marry Guan. The risks of surgery, including blood clot and infection, were discussed with the patient. Measures to reduce these risks, including the use of anticoagulation, perioperative antibiotics, and early ambulation were discussed. The importance of postoperative physical therapy was discussed with the patient. The patient elects to proceed with surgery. The patient is instructed to stop all blood thinners prior to surgery. The patient is instructed to call the hospital the day before surgery to learn of the proper arrival time.  ? ?Contact our office with any questions or concerns. Follow up as indicated, or sooner should any new problems arise, if conditions worsen, or if they are otherwise concerned.  ? ?Gwenlyn Fudge, PA -C ?[facility] ?[address] ?[place], [place] [address] ?Phone: [phone] ? ?This note was generated in part with voice recognition software and I apologize for any typographical errors that were not detected and corrected. ? ?Electronically signed by Gwenlyn Fudge, PA at 02/02/2022 11:48 AM EDT ? ?

## 2022-02-08 ENCOUNTER — Other Ambulatory Visit: Admission: RE | Admit: 2022-02-08 | Payer: Commercial Managed Care - PPO | Source: Ambulatory Visit

## 2022-02-09 MED ORDER — GABAPENTIN 300 MG PO CAPS: 300.0000 mg | ORAL_CAPSULE | Freq: Once | ORAL | Status: AC

## 2022-02-09 MED ORDER — TRANEXAMIC ACID-NACL 1000-0.7 MG/100ML-% IV SOLN
1000.0000 mg | INTRAVENOUS | Status: AC
  Administered 2022-02-10: 1000 mg via INTRAVENOUS

## 2022-02-09 MED ORDER — ORAL CARE MOUTH RINSE: 15.0000 mL | Freq: Once | OROMUCOSAL | Status: AC

## 2022-02-09 MED ORDER — CHLORHEXIDINE GLUCONATE 4 % EX LIQD: 60.0000 mL | Freq: Once | CUTANEOUS | Status: DC

## 2022-02-09 MED ORDER — FAMOTIDINE 20 MG PO TABS: 20.0000 mg | ORAL_TABLET | Freq: Once | ORAL | Status: AC

## 2022-02-09 MED ORDER — LACTATED RINGERS IV SOLN: INTRAVENOUS | Status: DC

## 2022-02-09 MED ORDER — CEFAZOLIN SODIUM-DEXTROSE 2-4 GM/100ML-% IV SOLN
2.0000 g | INTRAVENOUS | Status: AC
  Administered 2022-02-10: 2 g via INTRAVENOUS

## 2022-02-09 MED ORDER — CELECOXIB 200 MG PO CAPS: 400.0000 mg | ORAL_CAPSULE | Freq: Once | ORAL | Status: AC

## 2022-02-09 MED ORDER — CHLORHEXIDINE GLUCONATE 0.12 % MT SOLN: 15.0000 mL | Freq: Once | OROMUCOSAL | Status: AC

## 2022-02-09 MED ORDER — DEXAMETHASONE SODIUM PHOSPHATE 10 MG/ML IJ SOLN: 8.0000 mg | Freq: Once | INTRAMUSCULAR | Status: AC

## 2022-02-09 MED ORDER — VANCOMYCIN HCL IN DEXTROSE 1-5 GM/200ML-% IV SOLN
1000.0000 mg | Freq: Once | INTRAVENOUS | Status: AC
  Administered 2022-02-10: 1000 mg via INTRAVENOUS

## 2022-02-10 ENCOUNTER — Ambulatory Visit: Payer: Commercial Managed Care - PPO | Admitting: Certified Registered Nurse Anesthetist

## 2022-02-10 ENCOUNTER — Ambulatory Visit: Payer: Commercial Managed Care - PPO | Admitting: Urgent Care

## 2022-02-10 ENCOUNTER — Observation Stay
Admission: RE | Admit: 2022-02-10 | Discharge: 2022-02-11 | Disposition: A | Discharge status: Home Health | Payer: Commercial Managed Care - PPO | Attending: Orthopedic Surgery | Admitting: Orthopedic Surgery

## 2022-02-10 ENCOUNTER — Encounter: Payer: Self-pay | Admitting: Orthopedic Surgery

## 2022-02-10 ENCOUNTER — Other Ambulatory Visit: Payer: Self-pay

## 2022-02-10 ENCOUNTER — Observation Stay: Payer: Commercial Managed Care - PPO

## 2022-02-10 ENCOUNTER — Encounter
Admission: RE | Disposition: A | Discharge status: Home Health | Payer: Self-pay | Source: Home / Self Care | Attending: Orthopedic Surgery

## 2022-02-10 DIAGNOSIS — M1712 Unilateral primary osteoarthritis, left knee: Principal | ICD-10-CM | POA: Insufficient documentation

## 2022-02-10 DIAGNOSIS — R7303 Prediabetes: Secondary | ICD-10-CM | POA: Insufficient documentation

## 2022-02-10 DIAGNOSIS — Z6838 Body mass index (BMI) 38.0-38.9, adult: Secondary | ICD-10-CM | POA: Insufficient documentation

## 2022-02-10 DIAGNOSIS — Z96659 Presence of unspecified artificial knee joint: Secondary | ICD-10-CM

## 2022-02-10 DIAGNOSIS — Z96652 Presence of left artificial knee joint: Secondary | ICD-10-CM

## 2022-02-10 HISTORY — PX: KNEE ARTHROPLASTY: SHX992

## 2022-02-10 LAB — ABO/RH: ABO/RH(D): O POS

## 2022-02-10 SURGERY — ARTHROPLASTY, KNEE, TOTAL, USING IMAGELESS COMPUTER-ASSISTED NAVIGATION
Anesthesia: General | Site: Knee | Laterality: Left

## 2022-02-10 MED ORDER — VANCOMYCIN HCL IN DEXTROSE 1-5 GM/200ML-% IV SOLN
INTRAVENOUS | Status: AC
  Filled 2022-02-10: qty 200

## 2022-02-10 MED ORDER — BUPIVACAINE LIPOSOME 1.3 % IJ SUSP
INTRAMUSCULAR | Status: AC
  Filled 2022-02-10: qty 40

## 2022-02-10 MED ORDER — PANTOPRAZOLE SODIUM 40 MG PO TBEC
40.0000 mg | DELAYED_RELEASE_TABLET | Freq: Two times a day (BID) | ORAL | Status: DC
  Administered 2022-02-10: 40 mg via ORAL

## 2022-02-10 MED ORDER — ACETAMINOPHEN 325 MG PO TABS: 325.0000 mg | ORAL_TABLET | Freq: Four times a day (QID) | ORAL | Status: DC | PRN

## 2022-02-10 MED ORDER — FENTANYL CITRATE (PF) 100 MCG/2ML IJ SOLN
INTRAMUSCULAR | Status: AC
Start: 2022-02-10 — End: ?
  Filled 2022-02-10: qty 2

## 2022-02-10 MED ORDER — HYDROMORPHONE HCL 1 MG/ML IJ SOLN
0.5000 mg | INTRAMUSCULAR | Status: DC | PRN
  Administered 2022-02-10: 1 mg via INTRAVENOUS

## 2022-02-10 MED ORDER — ONDANSETRON HCL 4 MG/2ML IJ SOLN
INTRAMUSCULAR | Status: DC | PRN
  Administered 2022-02-10: 4 mg via INTRAVENOUS

## 2022-02-10 MED ORDER — GABAPENTIN 300 MG PO CAPS
ORAL_CAPSULE | ORAL | Status: AC
  Administered 2022-02-10: 300 mg via ORAL
  Filled 2022-02-10: qty 1

## 2022-02-10 MED ORDER — CEFAZOLIN SODIUM-DEXTROSE 2-4 GM/100ML-% IV SOLN
INTRAVENOUS | Status: AC
  Administered 2022-02-10: 2 g via INTRAVENOUS
  Filled 2022-02-10: qty 100

## 2022-02-10 MED ORDER — PHENYLEPHRINE HCL-NACL 20-0.9 MG/250ML-% IV SOLN
INTRAVENOUS | Status: AC
  Filled 2022-02-10: qty 250

## 2022-02-10 MED ORDER — SODIUM CHLORIDE 0.9 % IR SOLN
Status: DC | PRN
  Administered 2022-02-10: 3000 mL

## 2022-02-10 MED ORDER — DEXAMETHASONE SODIUM PHOSPHATE 10 MG/ML IJ SOLN
INTRAMUSCULAR | Status: AC
  Administered 2022-02-10: 8 mg via INTRAVENOUS
  Filled 2022-02-10: qty 1

## 2022-02-10 MED ORDER — TRANEXAMIC ACID-NACL 1000-0.7 MG/100ML-% IV SOLN
INTRAVENOUS | Status: AC
  Administered 2022-02-10: 1000 mg via INTRAVENOUS
  Filled 2022-02-10: qty 100

## 2022-02-10 MED ORDER — CELECOXIB 200 MG PO CAPS: 200.0000 mg | ORAL_CAPSULE | Freq: Two times a day (BID) | ORAL | Status: DC

## 2022-02-10 MED ORDER — POLYVINYL ALCOHOL 1.4 % OP SOLN: 1.0000 [drp] | Freq: Four times a day (QID) | OPHTHALMIC | Status: DC | PRN

## 2022-02-10 MED ORDER — BUPIVACAINE HCL (PF) 0.5 % IJ SOLN
INTRAMUSCULAR | Status: AC
  Filled 2022-02-10: qty 10

## 2022-02-10 MED ORDER — ONDANSETRON HCL 4 MG/2ML IJ SOLN: 4.0000 mg | Freq: Four times a day (QID) | INTRAMUSCULAR | Status: DC | PRN

## 2022-02-10 MED ORDER — BUPIVACAINE HCL (PF) 0.5 % IJ SOLN
INTRAMUSCULAR | Status: DC | PRN
Start: 2022-02-10 — End: 2022-02-10
  Administered 2022-02-10: 3 mL

## 2022-02-10 MED ORDER — TRAMADOL HCL 50 MG PO TABS: 50.0000 mg | ORAL_TABLET | ORAL | Status: DC | PRN

## 2022-02-10 MED ORDER — PROPOFOL 10 MG/ML IV BOLUS
INTRAVENOUS | Status: DC | PRN
  Administered 2022-02-10: 20 mg via INTRAVENOUS

## 2022-02-10 MED ORDER — FERROUS SULFATE 325 (65 FE) MG PO TABS
ORAL_TABLET | ORAL | Status: AC
  Filled 2022-02-10: qty 1

## 2022-02-10 MED ORDER — EPHEDRINE SULFATE (PRESSORS) 50 MG/ML IJ SOLN
INTRAMUSCULAR | Status: DC | PRN
  Administered 2022-02-10: 10 mg via INTRAVENOUS

## 2022-02-10 MED ORDER — FAMOTIDINE 20 MG PO TABS
ORAL_TABLET | ORAL | Status: AC
  Administered 2022-02-10: 20 mg via ORAL
  Filled 2022-02-10: qty 1

## 2022-02-10 MED ORDER — SENNOSIDES-DOCUSATE SODIUM 8.6-50 MG PO TABS
1.0000 | ORAL_TABLET | Freq: Two times a day (BID) | ORAL | Status: DC
  Administered 2022-02-10: 1 via ORAL

## 2022-02-10 MED ORDER — OXYCODONE HCL 5 MG PO TABS: 5.0000 mg | ORAL_TABLET | ORAL | Status: DC | PRN

## 2022-02-10 MED ORDER — PHENYLEPHRINE HCL-NACL 20-0.9 MG/250ML-% IV SOLN
INTRAVENOUS | Status: DC | PRN
  Administered 2022-02-10: 40 ug/min via INTRAVENOUS

## 2022-02-10 MED ORDER — MIDAZOLAM HCL 5 MG/5ML IJ SOLN
INTRAMUSCULAR | Status: DC | PRN
  Administered 2022-02-10: 2 mg via INTRAVENOUS

## 2022-02-10 MED ORDER — PROPOFOL 1000 MG/100ML IV EMUL
INTRAVENOUS | Status: AC
  Filled 2022-02-10: qty 100

## 2022-02-10 MED ORDER — PHENOL 1.4 % MT LIQD: 1.0000 | OROMUCOSAL | Status: DC | PRN

## 2022-02-10 MED ORDER — BUPIVACAINE HCL (PF) 0.25 % IJ SOLN
INTRAMUSCULAR | Status: AC
  Filled 2022-02-10: qty 120

## 2022-02-10 MED ORDER — TRANEXAMIC ACID-NACL 1000-0.7 MG/100ML-% IV SOLN
INTRAVENOUS | Status: AC
  Filled 2022-02-10: qty 100

## 2022-02-10 MED ORDER — FENTANYL CITRATE (PF) 100 MCG/2ML IJ SOLN: 25.0000 ug | INTRAMUSCULAR | Status: DC | PRN

## 2022-02-10 MED ORDER — TRAMADOL HCL 50 MG PO TABS
ORAL_TABLET | ORAL | Status: AC
  Administered 2022-02-10: 50 mg via ORAL
  Filled 2022-02-10: qty 1

## 2022-02-10 MED ORDER — SODIUM CHLORIDE FLUSH 0.9 % IV SOLN
INTRAVENOUS | Status: AC
  Filled 2022-02-10: qty 80

## 2022-02-10 MED ORDER — FERROUS SULFATE 325 (65 FE) MG PO TABS
325.0000 mg | ORAL_TABLET | Freq: Two times a day (BID) | ORAL | Status: DC
  Administered 2022-02-10: 325 mg via ORAL

## 2022-02-10 MED ORDER — SENNOSIDES-DOCUSATE SODIUM 8.6-50 MG PO TABS
ORAL_TABLET | ORAL | Status: AC
Start: 2022-02-10 — End: 2022-02-10
  Filled 2022-02-10: qty 1

## 2022-02-10 MED ORDER — ENOXAPARIN SODIUM 30 MG/0.3ML IJ SOSY: 30.0000 mg | PREFILLED_SYRINGE | Freq: Two times a day (BID) | INTRAMUSCULAR | Status: DC

## 2022-02-10 MED ORDER — ONDANSETRON HCL 4 MG/2ML IJ SOLN: 4.0000 mg | Freq: Once | INTRAMUSCULAR | Status: DC | PRN

## 2022-02-10 MED ORDER — HYDROMORPHONE HCL 1 MG/ML IJ SOLN
INTRAMUSCULAR | Status: AC
  Filled 2022-02-10: qty 1

## 2022-02-10 MED ORDER — MAGNESIUM HYDROXIDE 400 MG/5ML PO SUSP
ORAL | Status: AC
  Filled 2022-02-10: qty 30

## 2022-02-10 MED ORDER — CEFAZOLIN SODIUM-DEXTROSE 2-4 GM/100ML-% IV SOLN: 2.0000 g | Freq: Four times a day (QID) | INTRAVENOUS | Status: AC

## 2022-02-10 MED ORDER — PROPOFOL 500 MG/50ML IV EMUL
INTRAVENOUS | Status: DC | PRN
  Administered 2022-02-10: 100 ug/kg/min via INTRAVENOUS

## 2022-02-10 MED ORDER — FLEET ENEMA 7-19 GM/118ML RE ENEM: 1.0000 | ENEMA | Freq: Once | RECTAL | Status: DC | PRN

## 2022-02-10 MED ORDER — ACETAMINOPHEN 10 MG/ML IV SOLN
INTRAVENOUS | Status: AC
Start: 2022-02-10 — End: 2022-02-10
  Administered 2022-02-10: 1000 mg via INTRAVENOUS
  Filled 2022-02-10: qty 100

## 2022-02-10 MED ORDER — ONDANSETRON HCL 4 MG PO TABS: 4.0000 mg | ORAL_TABLET | Freq: Four times a day (QID) | ORAL | Status: DC | PRN

## 2022-02-10 MED ORDER — SENNOSIDES-DOCUSATE SODIUM 8.6-50 MG PO TABS
ORAL_TABLET | ORAL | Status: AC
  Administered 2022-02-10: 1 via ORAL
  Filled 2022-02-10: qty 1

## 2022-02-10 MED ORDER — MENTHOL 3 MG MT LOZG: 1.0000 | LOZENGE | OROMUCOSAL | Status: DC | PRN

## 2022-02-10 MED ORDER — 0.9 % SODIUM CHLORIDE (POUR BTL) OPTIME
TOPICAL | Status: DC | PRN
  Administered 2022-02-10: 500 mL

## 2022-02-10 MED ORDER — PANTOPRAZOLE SODIUM 40 MG PO TBEC
DELAYED_RELEASE_TABLET | ORAL | Status: AC
  Administered 2022-02-10: 40 mg via ORAL
  Filled 2022-02-10: qty 1

## 2022-02-10 MED ORDER — CEFAZOLIN SODIUM-DEXTROSE 2-4 GM/100ML-% IV SOLN
INTRAVENOUS | Status: AC
  Filled 2022-02-10: qty 100

## 2022-02-10 MED ORDER — OXYCODONE HCL 5 MG PO TABS
ORAL_TABLET | ORAL | Status: AC
  Administered 2022-02-10: 10 mg via ORAL
  Filled 2022-02-10: qty 2

## 2022-02-10 MED ORDER — CHLORHEXIDINE GLUCONATE 0.12 % MT SOLN
OROMUCOSAL | Status: AC
  Administered 2022-02-10: 15 mL via OROMUCOSAL
  Filled 2022-02-10: qty 15

## 2022-02-10 MED ORDER — ACETAMINOPHEN 10 MG/ML IV SOLN
INTRAVENOUS | Status: AC
  Filled 2022-02-10: qty 100

## 2022-02-10 MED ORDER — SODIUM CHLORIDE (PF) 0.9 % IJ SOLN
INTRAMUSCULAR | Status: DC | PRN
  Administered 2022-02-10: 120 mL via INTRAMUSCULAR

## 2022-02-10 MED ORDER — PANTOPRAZOLE SODIUM 40 MG PO TBEC
DELAYED_RELEASE_TABLET | ORAL | Status: AC
  Filled 2022-02-10: qty 1

## 2022-02-10 MED ORDER — TRANEXAMIC ACID-NACL 1000-0.7 MG/100ML-% IV SOLN: 1000.0000 mg | Freq: Once | INTRAVENOUS | Status: AC

## 2022-02-10 MED ORDER — METOCLOPRAMIDE HCL 10 MG PO TABS
10.0000 mg | ORAL_TABLET | Freq: Three times a day (TID) | ORAL | Status: DC
  Administered 2022-02-10: 10 mg via ORAL

## 2022-02-10 MED ORDER — MIDAZOLAM HCL 2 MG/2ML IJ SOLN
INTRAMUSCULAR | Status: AC
  Filled 2022-02-10: qty 2

## 2022-02-10 MED ORDER — CELECOXIB 200 MG PO CAPS
ORAL_CAPSULE | ORAL | Status: AC
  Administered 2022-02-10: 400 mg via ORAL
  Filled 2022-02-10: qty 1

## 2022-02-10 MED ORDER — METOCLOPRAMIDE HCL 10 MG PO TABS
ORAL_TABLET | ORAL | Status: AC
  Filled 2022-02-10: qty 1

## 2022-02-10 MED ORDER — SODIUM CHLORIDE 0.9 % IV SOLN: INTRAVENOUS | Status: DC

## 2022-02-10 MED ORDER — ALUM & MAG HYDROXIDE-SIMETH 200-200-20 MG/5ML PO SUSP: 30.0000 mL | ORAL | Status: DC | PRN

## 2022-02-10 MED ORDER — DIPHENHYDRAMINE HCL 12.5 MG/5ML PO ELIX: 12.5000 mg | ORAL_SOLUTION | ORAL | Status: DC | PRN

## 2022-02-10 MED ORDER — ACETAMINOPHEN 10 MG/ML IV SOLN
INTRAVENOUS | Status: DC | PRN
  Administered 2022-02-10: 1000 mg via INTRAVENOUS

## 2022-02-10 MED ORDER — FENTANYL CITRATE (PF) 100 MCG/2ML IJ SOLN
INTRAMUSCULAR | Status: DC | PRN
  Administered 2022-02-10 (×2): 25 ug via INTRAVENOUS

## 2022-02-10 MED ORDER — MAGNESIUM HYDROXIDE 400 MG/5ML PO SUSP
30.0000 mL | Freq: Every day | ORAL | Status: DC
  Administered 2022-02-10: 30 mL via ORAL

## 2022-02-10 MED ORDER — BISACODYL 10 MG RE SUPP
10.0000 mg | Freq: Every day | RECTAL | Status: DC | PRN
  Filled 2022-02-10: qty 1

## 2022-02-10 MED ORDER — OXYCODONE HCL 5 MG PO TABS
ORAL_TABLET | ORAL | Status: AC
  Filled 2022-02-10: qty 2

## 2022-02-10 MED ORDER — OXYCODONE HCL 5 MG PO TABS
10.0000 mg | ORAL_TABLET | ORAL | Status: DC | PRN
  Administered 2022-02-10: 10 mg via ORAL

## 2022-02-10 MED ORDER — ACETAMINOPHEN 10 MG/ML IV SOLN: 1000.0000 mg | Freq: Four times a day (QID) | INTRAVENOUS | Status: AC

## 2022-02-10 MED ORDER — SURGIPHOR WOUND IRRIGATION SYSTEM - OPTIME
TOPICAL | Status: DC | PRN
  Administered 2022-02-10: 1

## 2022-02-10 SURGICAL SUPPLY — 81 items
ATTUNE MED DOME PAT 32 KNEE (Knees) ×1 IMPLANT
ATTUNE PS FEM LT SZ 4 CEM KNEE (Femur) ×1 IMPLANT
ATTUNE PSRP INSR SZ4 5 KNEE (Insert) ×1 IMPLANT
BASEPLATE TIBIAL ROTATING SZ 4 (Knees) ×1 IMPLANT
BATTERY INSTRU NAVIGATION (MISCELLANEOUS) ×12 IMPLANT
BLADE SAW 70X12.5 (BLADE) ×3 IMPLANT
BLADE SAW 90X13X1.19 OSCILLAT (BLADE) ×3 IMPLANT
BLADE SAW 90X25X1.19 OSCILLAT (BLADE) ×3 IMPLANT
BONE CEMENT GENTAMICIN (Cement) ×4 IMPLANT
BSPLAT TIB 4 CMNT ROT PLAT STR (Knees) ×1 IMPLANT
BTRY SRG DRVR LF (MISCELLANEOUS) ×4
CEMENT BONE GENTAMICIN 40 (Cement) IMPLANT
CEMENT HV SMART SET (Cement) IMPLANT
COOLER POLAR GLACIER W/PUMP (MISCELLANEOUS) ×3 IMPLANT
CUFF TOURN SGL QUICK 24 (TOURNIQUET CUFF)
CUFF TOURN SGL QUICK 34 (TOURNIQUET CUFF)
CUFF TRNQT CYL 24X4X16.5-23 (TOURNIQUET CUFF) IMPLANT
CUFF TRNQT CYL 34X4.125X (TOURNIQUET CUFF) IMPLANT
DRAPE 3/4 80X56 (DRAPES) ×3 IMPLANT
DRAPE INCISE IOBAN 66X45 STRL (DRAPES) ×2 IMPLANT
DRSG DERMACEA 8X12 NADH (GAUZE/BANDAGES/DRESSINGS) ×3 IMPLANT
DRSG MEPILEX SACRM 8.7X9.8 (GAUZE/BANDAGES/DRESSINGS) ×3 IMPLANT
DRSG OPSITE POSTOP 4X14 (GAUZE/BANDAGES/DRESSINGS) ×3 IMPLANT
DRSG TEGADERM 4X4.75 (GAUZE/BANDAGES/DRESSINGS) ×3 IMPLANT
DURAPREP 26ML APPLICATOR (WOUND CARE) ×6 IMPLANT
ELECT CAUTERY BLADE 6.4 (BLADE) ×3 IMPLANT
ELECT REM PT RETURN 9FT ADLT (ELECTROSURGICAL) ×2
ELECTRODE REM PT RTRN 9FT ADLT (ELECTROSURGICAL) ×2 IMPLANT
EX-PIN ORTHOLOCK NAV 4X150 (PIN) ×6 IMPLANT
GLOVE BIOGEL PI IND STRL 8 (GLOVE) ×2 IMPLANT
GLOVE BIOGEL PI INDICATOR 8 (GLOVE) ×1
GLOVE SURG ENC TEXT LTX SZ7.5 (GLOVE) ×12 IMPLANT
GLOVE SURG UNDER POLY LF SZ7.5 (GLOVE) ×3 IMPLANT
GOWN STRL REUS W/ TWL LRG LVL3 (GOWN DISPOSABLE) ×4 IMPLANT
GOWN STRL REUS W/ TWL XL LVL3 (GOWN DISPOSABLE) ×2 IMPLANT
GOWN STRL REUS W/TWL LRG LVL3 (GOWN DISPOSABLE) ×4
GOWN STRL REUS W/TWL XL LVL3 (GOWN DISPOSABLE) ×2
HEMOVAC 400CC 10FR (MISCELLANEOUS) ×3 IMPLANT
HOLDER FOLEY CATH W/STRAP (MISCELLANEOUS) ×3 IMPLANT
HOLSTER ELECTROSUGICAL PENCIL (MISCELLANEOUS) ×2 IMPLANT
HOOD PEEL AWAY FLYTE STAYCOOL (MISCELLANEOUS) ×6 IMPLANT
IV NS IRRIG 3000ML ARTHROMATIC (IV SOLUTION) ×3 IMPLANT
KIT TURNOVER KIT A (KITS) ×3 IMPLANT
KNIFE SCULPS 14X20 (INSTRUMENTS) ×3 IMPLANT
LABEL OR SOLS (LABEL) ×2 IMPLANT
MANIFOLD NEPTUNE II (INSTRUMENTS) ×6 IMPLANT
NDL SAFETY ECLIPSE 18X1.5 (NEEDLE) ×2 IMPLANT
NDL SPNL 20GX3.5 QUINCKE YW (NEEDLE) ×4 IMPLANT
NEEDLE HYPO 18GX1.5 SHARP (NEEDLE)
NEEDLE SPNL 20GX3.5 QUINCKE YW (NEEDLE) ×4 IMPLANT
NS IRRIG 500ML POUR BTL (IV SOLUTION) ×3 IMPLANT
PACK TOTAL KNEE (MISCELLANEOUS) ×3 IMPLANT
PAD ABD DERMACEA PRESS 5X9 (GAUZE/BANDAGES/DRESSINGS) ×6 IMPLANT
PAD WRAPON POLAR KNEE (MISCELLANEOUS) ×2 IMPLANT
PENCIL SMOKE EVACUATOR COATED (MISCELLANEOUS) ×2 IMPLANT
PIN DRILL FIX HALF THREAD (BIT) ×6 IMPLANT
PIN DRILL QUICK PACK ×2 IMPLANT
PIN FIXATION 1/8DIA X 3INL (PIN) ×3 IMPLANT
PULSAVAC PLUS IRRIG FAN TIP (DISPOSABLE) ×2
SOL PREP PVP 2OZ (MISCELLANEOUS) ×2
SOLUTION IRRIG SURGIPHOR (IV SOLUTION) ×1 IMPLANT
SOLUTION PREP PVP 2OZ (MISCELLANEOUS) ×2 IMPLANT
SOLUTION PRONTOSAN WOUND 350ML (IRRIGATION / IRRIGATOR) ×2 IMPLANT
SPONGE DRAIN TRACH 4X4 STRL 2S (GAUZE/BANDAGES/DRESSINGS) ×3 IMPLANT
STAPLER SKIN PROX 35W (STAPLE) ×3 IMPLANT
STOCKINETTE IMPERV 14X48 (MISCELLANEOUS) IMPLANT
STRAP TIBIA SHORT (MISCELLANEOUS) ×3 IMPLANT
SUCTION FRAZIER HANDLE 10FR (MISCELLANEOUS) ×2
SUCTION TUBE FRAZIER 10FR DISP (MISCELLANEOUS) ×2 IMPLANT
SUT VIC AB 0 CT1 36 (SUTURE) ×6 IMPLANT
SUT VIC AB 1 CT1 36 (SUTURE) ×6 IMPLANT
SUT VIC AB 2-0 CT2 27 (SUTURE) ×3 IMPLANT
SYR 20ML LL LF (SYRINGE) ×3 IMPLANT
SYR 30ML LL (SYRINGE) ×6 IMPLANT
TIP FAN IRRIG PULSAVAC PLUS (DISPOSABLE) ×2 IMPLANT
TOWEL OR 17X26 4PK STRL BLUE (TOWEL DISPOSABLE) ×2 IMPLANT
TOWER CARTRIDGE SMART MIX (DISPOSABLE) ×3 IMPLANT
TRAY FOLEY MTR SLVR 16FR STAT (SET/KITS/TRAYS/PACK) ×3 IMPLANT
WATER STERILE IRR 1000ML POUR (IV SOLUTION) ×1 IMPLANT
WATER STERILE IRR 500ML POUR (IV SOLUTION) ×2 IMPLANT
WRAPON POLAR PAD KNEE (MISCELLANEOUS) ×2

## 2022-02-10 NOTE — Anesthesia Preprocedure Evaluation (Signed)
Anesthesia Evaluation  ?Patient identified by MRN, date of birth, ID band ?Patient awake ? ? ? ?Reviewed: ?Allergy & Precautions, H&P , NPO status , Patient's Chart, lab work & pertinent test results, reviewed documented beta blocker date and time  ? ?Airway ?Mallampati: II ? ? ?Neck ROM: full ? ? ? Dental ? ?(+) Poor Dentition ?  ?Pulmonary ?neg pulmonary ROS, former smoker,  ?  ?Pulmonary exam normal ? ? ? ? ? ? ? Cardiovascular ?Exercise Tolerance: Good ?negative cardio ROS ?Normal cardiovascular exam ?Rhythm:regular Rate:Normal ? ? ?  ?Neuro/Psych ?negative neurological ROS ? negative psych ROS  ? GI/Hepatic ?negative GI ROS, Neg liver ROS,   ?Endo/Other  ?negative endocrine ROS ? Renal/GU ?negative Renal ROS  ?negative genitourinary ?  ?Musculoskeletal ? ? Abdominal ?  ?Peds ? Hematology ?negative hematology ROS ?(+)   ?Anesthesia Other Findings ?Past Medical History: ?04/16/2015: Abnormal liver enzymes ?04/16/2015: Avitaminosis D ?No date: Benign neoplasm of descending colon ?No date: Benign neoplasm of sigmoid colon ?07/14/2020: Bilateral primary osteoarthritis of knee ?04/16/2015: Borderline diabetes ?04/16/2015: Calcium blood increased ?04/16/2015: Fibroid ?04/23/2016: Hypercholesterolemia with hypertriglyceridemia ?No date: Obesity (BMI 30-39.9) ?04/16/2015: Post menopausal syndrome ?No date: Pre-diabetes ?No date: Rectal polyp ?Past Surgical History: ?12/26/2015: COLONOSCOPY WITH PROPOFOL; N/A ?    Comment:  Procedure: COLONOSCOPY WITH PROPOFOL;  Surgeon: Evangeline Gula  ?             Allen Norris, MD;  Location: [facility];  Service:  ?             Endoscopy;  Laterality: N/A; ?12/26/2015: POLYPECTOMY ?    Comment:  Procedure: POLYPECTOMY;  Surgeon: Lucilla Lame, MD;   ?             Location: [facility];  Service: Endoscopy;; ?BMI   ? Body Mass Index: 38.41 kg/m?  ?  ? Reproductive/Obstetrics ?negative OB ROS ? ?  ? ? ? ? ? ? ? ? ? ? ? ? ? ?  ?   ? ? ? ? ? ? ? ? ?Anesthesia Physical ?Anesthesia Plan ? ?ASA: 2 ? ?Anesthesia Plan: General and Spinal  ? ?Post-op Pain Management:   ? ?Induction:  ? ?PONV Risk Score and Plan: 4 or greater ? ?Airway Management Planned:  ? ?Additional Equipment:  ? ?Intra-op Plan:  ? ?Post-operative Plan:  ? ?Informed Consent: I have reviewed the patients History and Physical, chart, labs and discussed the procedure including the risks, benefits and alternatives for the proposed anesthesia with the patient or authorized representative who has indicated his/her understanding and acceptance.  ? ? ? ?Dental Advisory Given ? ?Plan Discussed with: CRNA ? ?Anesthesia Plan Comments:   ? ? ? ? ? ? ?Anesthesia Quick Evaluation ? ?

## 2022-02-10 NOTE — Anesthesia Procedure Notes (Signed)
Spinal ? ?Patient location during procedure: OR ?Start time: 02/10/2022 7:20 AM ?End time: 02/10/2022 7:35 AM ?Reason for block: surgical anesthesia ?Staffing ?Performed: anesthesiologist and resident/CRNA  ?Anesthesiologist: Molli Barrows, MD ?Resident/CRNA: Tollie Eth, CRNA ?Preanesthetic Checklist ?Completed: patient identified, IV checked, site marked, risks and benefits discussed, surgical consent, monitors and equipment checked, pre-op evaluation and timeout performed ?Spinal Block ?Patient position: sitting ?Prep: Betadine ?Patient monitoring: heart rate, continuous pulse ox, blood pressure and cardiac monitor ?Approach: midline ?Location: L4-5 ?Injection technique: single-shot ?Needle ?Needle type: Whitacre and Introducer  ?Needle gauge: 24 G ?Needle length: 9 cm ?Assessment ?Events: CSF return ?Additional Notes ?Negative paresthesia. Negative blood return. Positive free-flowing CSF. Expiration date of kit checked and confirmed. Patient tolerated procedure well, without complications. ? ? ? ? ? ?

## 2022-02-10 NOTE — Progress Notes (Signed)
The patient has a Conservation officer, nature and a 3 in 1 at home but needs a shower seat, I notified [employer], will deliver to the bedside ?

## 2022-02-10 NOTE — H&P (Signed)
The patient has been re-examined, and the chart reviewed, and there have been no interval changes to the documented history and physical.    The risks, benefits, and alternatives have been discussed at length. The patient expressed understanding of the risks benefits and agreed with plans for surgical intervention.  Adele Milson P. Caterine Mcmeans, Jr. M.D.    

## 2022-02-10 NOTE — Progress Notes (Cosign Needed)
Patient is not able to walk the distance required to go the bathroom, or he/she is unable to safely negotiate stairs required to access the bathroom.  A 3in1 BSC will alleviate this problem  

## 2022-02-10 NOTE — Progress Notes (Signed)
PT Cancellation Note ? ?Patient Details ?Name: Annette Bradford ?MRN: [id number] ?DOB: [birth year] ? ? ?Cancelled Treatment:    Reason Eval/Treat Not Completed: Patient not medically ready. Orders received and chart reviewed. Upon entry to room, pt's spinal anesthesia still present. Pt only able to minimally DF/PF at ankle and minimally perform quad set. Will re-attempt when pt has improved quad control for weightbearing activity. ? ? ?Salem Caster. Fairly IV, PT, DPT ?Physical Therapist- [facility]  ?[facility]  ?02/10/2022, 2:06 PM ?

## 2022-02-10 NOTE — Op Note (Signed)
OPERATIVE NOTE ? ?DATE OF SURGERY:  02/10/2022 ? ?PATIENT NAME:  Annette Bradford   ?DOB: 11/19/1965  ?MRN: [id number] ? ?PRE-OPERATIVE DIAGNOSIS: Degenerative arthrosis of the left knee, primary ? ?POST-OPERATIVE DIAGNOSIS:  Same ? ?PROCEDURE:  Left total knee arthroplasty using computer-assisted navigation ? ?SURGEON:  Marciano Sequin. M.D. ? ?ASSISTANT: Cassell Smiles, PA-C (present and scrubbed throughout the case, critical for assistance with exposure, retraction, instrumentation, and closure) ? ?ANESTHESIA: spinal ? ?ESTIMATED BLOOD LOSS: 50 mL ? ?FLUIDS REPLACED: 1300 mL of crystalloid ? ?TOURNIQUET TIME: 93 minutes ? ?DRAINS: 2 medium Hemovac drains ? ?SOFT TISSUE RELEASES: Anterior cruciate ligament, posterior cruciate ligament, deep medial collateral ligament, patellofemoral ligament ? ?IMPLANTS UTILIZED: DePuy Attune size 4 posterior stabilized femoral component (cemented), size 4 rotating platform tibial component (cemented), 32 mm medialized dome patella (cemented), and a 5 mm stabilized rotating platform polyethylene insert. ? ?INDICATIONS FOR SURGERY: Annette Bradford is a 56 y.o. year old female with a long history of progressive knee pain. X-rays demonstrated severe degenerative changes in tricompartmental fashion. The patient had not seen any significant improvement despite conservative nonsurgical intervention. After discussion of the risks and benefits of surgical intervention, the patient expressed understanding of the risks benefits and agree with plans for total knee arthroplasty.  ? ?The risks, benefits, and alternatives were discussed at length including but not limited to the risks of infection, bleeding, nerve injury, stiffness, blood clots, the need for revision surgery, cardiopulmonary complications, among others, and they were willing to proceed. ? ?PROCEDURE IN DETAIL: The patient was brought into the operating room and, after adequate spinal anesthesia was achieved, a tourniquet was  placed on the patient's upper thigh. The patient's knee and leg were cleaned and prepped with alcohol and DuraPrep and draped in the usual sterile fashion. A "timeout" was performed as per usual protocol. The lower extremity was exsanguinated using an Esmarch, and the tourniquet was inflated to 300 mmHg. An anterior longitudinal incision was made followed by a standard mid vastus approach. The deep fibers of the medial collateral ligament were elevated in a subperiosteal fashion off of the medial flare of the tibia so as to maintain a continuous soft tissue sleeve. The patella was subluxed laterally and the patellofemoral ligament was incised. Inspection of the knee demonstrated severe degenerative changes with full-thickness loss of articular cartilage. Osteophytes were debrided using a rongeur. Anterior and posterior cruciate ligaments were excised. Two 4.0 mm Schanz pins were inserted in the femur and into the tibia for attachment of the array of trackers used for computer-assisted navigation. Hip center was identified using a circumduction technique. Distal landmarks were mapped using the computer. The distal femur and proximal tibia were mapped using the computer. The distal femoral cutting guide was positioned using computer-assisted navigation so as to achieve a 5? distal valgus cut. The femur was sized and it was felt that a size 4 femoral component was appropriate. A size 4 femoral cutting guide was positioned and the anterior cut was performed and verified using the computer. This was followed by completion of the posterior and chamfer cuts. Femoral cutting guide for the central box was then positioned in the center box cut was performed. ? ?Attention was then directed to the proximal tibia. Medial and lateral menisci were excised. The extramedullary tibial cutting guide was positioned using computer-assisted navigation so as to achieve a 0? varus-valgus alignment and 3? posterior slope. The cut was  performed and verified using the computer. The proximal tibia was  sized and it was felt that a size 4 tibial tray was appropriate. Tibial and femoral trials were inserted followed by insertion of a 5 mm polyethylene insert. This allowed for excellent mediolateral soft tissue balancing both in flexion and in full extension. Finally, the patella was cut and prepared so as to accommodate a 32 mm medialized dome patella. A patella trial was placed and the knee was placed through a range of motion with excellent patellar tracking appreciated. The femoral trial was removed after debridement of posterior osteophytes. The central post-hole for the tibial component was reamed followed by insertion of a keel punch. Tibial trials were then removed. Cut surfaces of bone were irrigated with copious amounts of normal saline using pulsatile lavage and then suctioned dry. Polymethylmethacrylate cement with gentamicin was prepared in the usual fashion using a vacuum mixer. Cement was applied to the cut surface of the proximal tibia as well as along the undersurface of a size 4 rotating platform tibial component. Tibial component was positioned and impacted into place. Excess cement was removed using [employer] Service fast streamer. Cement was then applied to the cut surfaces of the femur as well as along the posterior flanges of the size 4 femoral component. The femoral component was positioned and impacted into place. Excess cement was removed using [employer] fast streamer. A 5 mm polyethylene trial was inserted and the knee was brought into full extension with steady axial compression applied. Finally, cement was applied to the backside of a 32 mm medialized dome patella and the patellar component was positioned and patellar clamp applied. Excess cement was removed using [employer] fast streamer. After adequate curing of the cement, the tourniquet was deflated after a total tourniquet time of 93 minutes. Hemostasis was achieved using electrocautery. The knee was  irrigated with copious amounts of normal saline using pulsatile lavage followed by 450 ml of Surgiphor and then suctioned dry. 20 mL of 1.3% Exparel and 60 mL of 0.25% Marcaine in 40 mL of normal saline was injected along the posterior capsule, medial and lateral gutters, and along the arthrotomy site. A 5 mm stabilized rotating platform polyethylene insert was inserted and the knee was placed through a range of motion with excellent mediolateral soft tissue balancing appreciated and excellent patellar tracking noted. 2 medium drains were placed in the wound bed and brought out through separate stab incisions. The medial parapatellar portion of the incision was reapproximated using interrupted sutures of #1 Vicryl. Subcutaneous tissue was approximated in layers using first #0 Vicryl followed #2-0 Vicryl. The skin was approximated with skin staples. A sterile dressing was applied. ? ?The patient tolerated the procedure well and was transported to the recovery room in stable condition.   ? ?Haizlee Henton P. Holley Bouche., M.D.  ?

## 2022-02-10 NOTE — Transfer of Care (Signed)
Immediate Anesthesia Transfer of Care Note ? ?Patient: Annette Bradford ? ?Procedure(s) Performed: COMPUTER ASSISTED TOTAL KNEE ARTHROPLASTY (Left: Knee) ? ?Patient Location: PACU ? ?Anesthesia Type:Spinal ? ?Level of Consciousness: awake, alert  and oriented ? ?Airway & Oxygen Therapy: Patient Spontanous Breathing and Patient connected to face mask oxygen ? ?Post-op Assessment: Report given to RN and Post -op Vital signs reviewed and stable ? ?Post vital signs: Reviewed and stable ? ?Last Vitals:  ?Vitals Value Taken Time  ?BP 103/70 02/10/22 1130  ?Temp 36.6 ?C 02/10/22 1118  ?Pulse 97 02/10/22 1131  ?Resp 24 02/10/22 1131  ?SpO2 92 % 02/10/22 1131  ?Vitals shown include unvalidated device data. ? ?Last Pain:  ?Vitals:  ? 02/10/22 1118  ?TempSrc:   ?PainSc: 0-No pain  ?   ? ?  ? ?Complications: No notable events documented. ?

## 2022-02-10 NOTE — TOC Progression Note (Signed)
Transition of Care (TOC) - Progression Note  ? ? ?Patient Details  ?Name: Annette Bradford ?MRN: [id number] ?Date of Birth: 1966-02-19 ? ?Transition of Care (TOC) CM/SW Contact  ?Conception Oms, RN ?Phone Number: ?02/10/2022, 2:12 PM ? ?Clinical Narrative:   Patient is set up with [place] for [facility] services prior to surgery by Surgeons office ? ? ? ?  ?  ? ?Expected Discharge Plan and Services ?  ?  ?  ?  ?  ?                ?  ?  ?  ?  ?  ?  ?  ?  ?  ?  ? ? ?Social Determinants of Health (SDOH) Interventions ?  ? ?Readmission Risk Interventions ?   ? View : No data to display.  ?  ?  ?  ? ? ?

## 2022-02-10 NOTE — Evaluation (Signed)
Physical Therapy Evaluation ?Patient Details ?Name: Annette Bradford ?MRN: [id number] ?DOB: Dec 12, 1965 ?Today's Date: 02/10/2022 ? ?History of Present Illness ? Annette Bradford is s/p elective L TKA on 02/10/22.  ?Clinical Impression ? Pt admitted with above diagnosis. Pt received upright in bed agreeable to PT services. Reports no pain currently, reports home set up, PLOF, and DME at pt's disposal. At baseline pt independent with all mobility and ADL's/IADL's and has 24/7 family support. Pt tolerating LE therex well with education and intermittent need for AAROM initially. Pt able to perform x5 SLR indicating good quad control. Mod-I to attain sitting EoB and supervision to stand to RW with VC's for hand placement. Pt tolerating 50' step through gait although antalgic, to bathroom displaying independence in perihygiene, hand hygiene and transfers and walking to exit bathroom. Additional 38' performed with supervision and good stability throughout with only need for light touch on RW for support displaying safe turning and stand to sit transfers to recliner. Education provided on appropriate positioning of surgical limb to prevent contracture with ice applied and all needs in reach.  Pending stair training, pt safe to d/c to home setting with HHPT services to progress mobility to PLOF. Will continue to follow acutely to complete stair training, progress mobility, and understanding of HEP. Pt currently with functional limitations due to the deficits listed below (see PT Problem List). Pt will benefit from skilled PT to increase their independence and safety with mobility to allow discharge to the venue listed below.    ? ?Recommendations for follow up therapy are one component of a multi-disciplinary discharge planning process, led by the attending physician.  Recommendations may be updated based on patient status, additional functional criteria and insurance authorization. ? ?Follow Up Recommendations Home health  PT ? ?  ?Assistance Recommended at Discharge Set up Supervision/Assistance  ?Patient can return home with the following ? A little help with walking and/or transfers;A little help with bathing/dressing/bathroom;Assistance with cooking/housework;Assist for transportation;Help with stairs or ramp for entrance ? ?  ?Equipment Recommendations BSC/3in1  ?Recommendations for Other Services ?    ?  ?Functional Status Assessment Patient has had a recent decline in their functional status and demonstrates the ability to make significant improvements in function in a reasonable and predictable amount of time.  ? ?  ?Precautions / Restrictions Precautions ?Precautions: Knee ?Precaution Booklet Issued: No ?Restrictions ?Weight Bearing Restrictions: Yes ?LLE Weight Bearing: Weight bearing as tolerated  ? ?  ? ?Mobility ? Bed Mobility ?Overal bed mobility: Modified Independent ?  ?  ?  ?  ?  ?  ?General bed mobility comments: HOB elevated ?Patient Response: Cooperative ? ?Transfers ?Overall transfer level: Needs assistance ?Equipment used: Rolling walker (2 wheels) ?Transfers: Sit to/from Stand ?Sit to Stand: Supervision ?  ?  ?  ?  ?  ?General transfer comment: VC's for hand placement ?  ? ?Ambulation/Gait ?Ambulation/Gait assistance: Supervision ?Gait Distance (Feet): 130 Feet (50' + 40' with seated rest b/t bouts for toileting) ?Assistive device: Rolling walker (2 wheels) ?Gait Pattern/deviations: Step-through pattern, Decreased stance time - left, Decreased step length - right ?  ?  ?  ?General Gait Details: Able to perform step through gait with minor antalgia on LLE ? ?Stairs ?  ?  ?  ?  ?  ? ?Wheelchair Mobility ?  ? ?Modified Rankin (Stroke Patients Only) ?  ? ?  ? ?Balance Overall balance assessment: Needs assistance ?Sitting-balance support: No upper extremity supported, Feet supported ?Sitting balance-Leahy Scale: Fair ?  ?  ?  Standing balance support: Bilateral upper extremity supported, During functional  activity ?Standing balance-Leahy Scale: Good ?Standing balance comment: Able to assist donning hospital underwear on with RN assist ?  ?  ?  ?  ?  ?  ?  ?  ?  ?  ?  ?   ? ? ? ?Pertinent Vitals/Pain Pain Assessment ?Pain Assessment: Faces ?Faces Pain Scale: Hurts a little bit ?Pain Location: L knee ?Pain Descriptors / Indicators: Sore ?Pain Intervention(s): Limited activity within patient's tolerance, Monitored during session, Premedicated before session, Repositioned, Ice applied  ? ? ?Home Living Family/patient expects to be discharged to:: Private residence ?Living Arrangements: Spouse/significant other ?Available Help at Discharge: Family;Available 24 hours/day ?Type of Home: House ?Home Access: Stairs to enter ?Entrance Stairs-Rails: None ?Entrance Stairs-Number of Steps: 2 ?Alternate Level Stairs-Number of Steps: Flight ?Home Layout: Two level;Able to live on main level with bedroom/bathroom ?Home Equipment: Conservation officer, nature (2 wheels);Hospital bed;Wheelchair - manual;BSC/3in1 ?   ?  ?Prior Function Prior Level of Function : Independent/Modified Independent ?  ?  ?  ?  ?  ?  ?  ?  ?  ? ? ?Hand Dominance  ?   ? ?  ?Extremity/Trunk Assessment  ? Upper Extremity Assessment ?Upper Extremity Assessment: Defer to OT evaluation ?  ? ?Lower Extremity Assessment ?Lower Extremity Assessment: Overall WFL for tasks assessed;LLE deficits/detail ?LLE Deficits / Details: L TKA ?LLE Sensation: WNL ?  ? ?Cervical / Trunk Assessment ?Cervical / Trunk Assessment: Normal  ?Communication  ? Communication: No difficulties  ?Cognition Arousal/Alertness: Awake/alert ?Behavior During Therapy: [facility] for tasks assessed/performed ?Overall Cognitive Status: Within Functional Limits for tasks assessed ?  ?  ?  ?  ?  ?  ?  ?  ?  ?  ?  ?  ?  ?  ?  ?  ?General Comments: Pleasant and cooperative ?  ?  ? ?  ?General Comments   ? ?  ?Exercises Total Joint Exercises ?Ankle Circles/Pumps: AROM, Both, 10 reps, Supine ?Quad Sets: AROM, Strengthening,  Left, 10 reps, Supine ?Heel Slides: AAROM, Left, 10 reps, Supine ?Hip ABduction/ADduction: AROM, Strengthening, Left, 10 reps, Supine ?Straight Leg Raises: AROM, Strengthening, Left, 5 reps, Supine ?Marching in Standing: AROM, Seated, Both, 5 reps ?Other Exercises ?Other Exercises: Role of PT in acute setting, WB precautions, D/c recs, safe use of DME  ? ?Assessment/Plan  ?  ?PT Assessment Patient needs continued PT services  ?PT Problem List Decreased strength;Decreased mobility;Decreased range of motion;Decreased balance;Pain ? ?   ?  ?PT Treatment Interventions DME instruction;Therapeutic exercise;Gait training;Balance training;Stair training;Neuromuscular re-education;Functional mobility training;Therapeutic activities;Patient/family education   ? ?PT Goals (Current goals can be found in the Care Plan section)  ?Acute Rehab PT Goals ?Patient Stated Goal: to go home ?PT Goal Formulation: With patient ?Time For Goal Achievement: 02/24/22 ?Potential to Achieve Goals: Good ? ?  ?Frequency BID ?  ? ? ?Co-evaluation   ?  ?  ?  ?  ? ? ?  ?AM-PAC PT "6 Clicks" Mobility  ?Outcome Measure Help needed turning from your back to your side while in a flat bed without using bedrails?: None ?Help needed moving from lying on your back to sitting on the side of a flat bed without using bedrails?: None ?Help needed moving to and from a bed to a chair (including a wheelchair)?: A Little ?Help needed standing up from a chair using your arms (e.g., wheelchair or bedside chair)?: A Little ?Help needed to walk in hospital room?: A  Little ?Help needed climbing 3-5 steps with a railing? : A Little ?6 Click Score: 20 ? ?  ?End of Session Equipment Utilized During Treatment: Gait belt ?Activity Tolerance: Patient tolerated treatment well ?Patient left: in chair;with call bell/phone within reach;with nursing/sitter in room;with family/visitor present ?Nurse Communication: Mobility status ?PT Visit Diagnosis: Other abnormalities of gait and  mobility (R26.89);Muscle weakness (generalized) (M62.81) ?  ? ?Time: 0721-8288 ?PT Time Calculation (min) (ACUTE ONLY): 27 min ? ? ?Charges:   PT Evaluation ?$PT Eval Low Complexity: 1 Low ?PT Treatments ?$Gait Traini

## 2022-02-10 NOTE — Anesthesia Procedure Notes (Signed)
Procedure Name: [place] ?Date/Time: 02/10/2022 7:38 AM ?Performed by: Tollie Eth, CRNA ?Pre-anesthesia Checklist: Patient identified, Emergency Drugs available, Suction available and Patient being monitored ?Patient Re-evaluated:Patient Re-evaluated prior to induction ?Oxygen Delivery Method: Nasal cannula ?Induction Type: IV induction ?Placement Confirmation: positive ETCO2 ? ? ? ? ?

## 2022-02-11 DIAGNOSIS — M1712 Unilateral primary osteoarthritis, left knee: Secondary | ICD-10-CM | POA: Diagnosis not present

## 2022-02-11 MED ORDER — SENNOSIDES-DOCUSATE SODIUM 8.6-50 MG PO TABS
ORAL_TABLET | ORAL | Status: AC
Start: 2022-02-11 — End: 2022-02-11
  Administered 2022-02-11: 1 via ORAL
  Filled 2022-02-11: qty 1

## 2022-02-11 MED ORDER — ENOXAPARIN SODIUM 40 MG/0.4ML IJ SOSY: 40.0000 mg | PREFILLED_SYRINGE | INTRAMUSCULAR | 0 refills | Status: DC

## 2022-02-11 MED ORDER — CELECOXIB 200 MG PO CAPS
ORAL_CAPSULE | ORAL | Status: AC
  Administered 2022-02-11: 200 mg via ORAL
  Filled 2022-02-11: qty 1

## 2022-02-11 MED ORDER — CELECOXIB 200 MG PO CAPS
200.0000 mg | ORAL_CAPSULE | Freq: Two times a day (BID) | ORAL | 0 refills | Status: DC
Start: 2022-02-11 — End: 2022-07-29

## 2022-02-11 MED ORDER — OXYCODONE HCL 5 MG PO TABS
ORAL_TABLET | ORAL | Status: AC
  Administered 2022-02-11: 10 mg via ORAL
  Filled 2022-02-11: qty 2

## 2022-02-11 MED ORDER — ENOXAPARIN SODIUM 30 MG/0.3ML IJ SOSY
PREFILLED_SYRINGE | INTRAMUSCULAR | Status: AC
  Administered 2022-02-11: 30 mg via SUBCUTANEOUS
  Filled 2022-02-11: qty 0.3

## 2022-02-11 MED ORDER — PANTOPRAZOLE SODIUM 40 MG PO TBEC
DELAYED_RELEASE_TABLET | ORAL | Status: AC
Start: 2022-02-11 — End: 2022-02-11
  Administered 2022-02-11: 40 mg via ORAL
  Filled 2022-02-11: qty 1

## 2022-02-11 MED ORDER — MAGNESIUM HYDROXIDE 400 MG/5ML PO SUSP
ORAL | Status: AC
  Administered 2022-02-11: 30 mL via ORAL
  Filled 2022-02-11: qty 30

## 2022-02-11 MED ORDER — OXYCODONE HCL 5 MG PO TABS: 5.0000 mg | ORAL_TABLET | ORAL | 0 refills | Status: DC | PRN

## 2022-02-11 MED ORDER — FERROUS SULFATE 325 (65 FE) MG PO TABS
ORAL_TABLET | ORAL | Status: AC
Start: 2022-02-11 — End: 2022-02-11
  Administered 2022-02-11: 325 mg via ORAL
  Filled 2022-02-11: qty 1

## 2022-02-11 MED ORDER — ACETAMINOPHEN 10 MG/ML IV SOLN
INTRAVENOUS | Status: AC
Start: 2022-02-11 — End: 2022-02-11
  Administered 2022-02-11: 1000 mg via INTRAVENOUS
  Filled 2022-02-11: qty 100

## 2022-02-11 MED ORDER — ACETAMINOPHEN 10 MG/ML IV SOLN
INTRAVENOUS | Status: AC
  Administered 2022-02-11: 1000 mg via INTRAVENOUS
  Filled 2022-02-11: qty 100

## 2022-02-11 MED ORDER — METOCLOPRAMIDE HCL 10 MG PO TABS
ORAL_TABLET | ORAL | Status: AC
  Administered 2022-02-11: 10 mg via ORAL
  Filled 2022-02-11: qty 1

## 2022-02-11 MED ORDER — TRAMADOL HCL 50 MG PO TABS: 50.0000 mg | ORAL_TABLET | ORAL | 0 refills | Status: DC | PRN

## 2022-02-11 NOTE — TOC Progression Note (Signed)
Transition of Care (TOC) - Progression Note  ? ? ?Patient Details  ?Name: Annette Bradford ?MRN: [id number] ?Date of Birth: 1966/10/24 ? ?Transition of Care (TOC) CM/SW Contact  ?Conception Oms, RN ?Phone Number: ?02/11/2022, 10:24 AM ? ?Clinical Narrative:  The patient has a standard walker at home and will need a rolling walker, [employer] to deliver the RW to the bedside  ? ? ? ?Expected Discharge Plan: [place] ?Barriers to Discharge: Barriers Resolved ? ?Expected Discharge Plan and Services ?Expected Discharge Plan: [place] ?  ?Discharge Planning Services: CM Consult ?  ?Living arrangements for the past 2 months: [place] ?Expected Discharge Date: 02/11/22               ?DME Arranged: Shower stool ?DME Agency: [employer] ?Date DME Agency Contacted: 02/10/22 ?Time DME Agency Contacted: [phone] ?Representative spoke with at DME Agency: Suanne Marker ?HH Arranged: PT ?[place] Agency: [place] ?Date HH Agency Contacted: 02/10/22 ?Time [place]: [phone] ?Representative spoke with at [place]: [place] ? ? ?Social Determinants of Health (SDOH) Interventions ?  ? ?Readmission Risk Interventions ?   ? View : No data to display.  ?  ?  ?  ? ? ?

## 2022-02-11 NOTE — Progress Notes (Signed)
?  Subjective: ?1 Day Post-Op Procedure(s) (LRB): ?COMPUTER ASSISTED TOTAL KNEE ARTHROPLASTY (Left) ?Patient reports pain as well-controlled.  Husband at bedside.  ?Patient is well, and has had no acute complaints or problems ?Plan is to go Home after hospital stay. ?Negative for chest pain and shortness of breath ?Fever: no ?Gastrointestinal: negative for nausea and vomiting.  Patient has had a bowel movement. ? ?Objective: ?Vital signs in last 24 hours: ?Temp:  [96.9 ?F (36.1 ?C)-97.9 ?F (36.6 ?C)] 97 ?F (36.1 ?C) (04/13 2725) ?Pulse Rate:  [74-99] 85 (04/13 0726) ?Resp:  [16-22] 18 (04/13 0726) ?BP: (89-125)/(59-84) 111/80 (04/13 0726) ?SpO2:  [92 %-100 %] 96 % (04/13 0726) ? ?Intake/Output from previous day: ? ?Intake/Output Summary (Last 24 hours) at 02/11/2022 0754 ?Last data filed at 02/11/2022 0400 ?Gross per 24 hour  ?Intake 3329.88 ml  ?Output 1730 ml  ?Net 1599.88 ml  ?  ?Intake/Output this shift: ?No intake/output data recorded. ? ?Labs: ?No results for input(s): HGB in the last 72 hours. ?No results for input(s): WBC, RBC, HCT, PLT in the last 72 hours. ?No results for input(s): NA, K, CL, CO2, BUN, CREATININE, GLUCOSE, CALCIUM in the last 72 hours. ?No results for input(s): LABPT, INR in the last 72 hours. ? ? ?EXAM ?General - Patient is Alert, Appropriate, and Oriented ?Extremity - Neurovascular intact ?Dorsiflexion/Plantar flexion intact ?Compartment soft ?Dressing/Incision -Postoperative dressing remains in place., Polar Care in place and working. , Hemovac in place. , Following removal of post-op dressing, bandage is clean.  ?Motor Function - intact, moving foot and toes well on exam. Able to perform independent SLR.  ?Cardiovascular- Regular rate and rhythm, no murmurs/rubs/gallops ?Respiratory- Lungs clear to auscultation bilaterally ?Gastrointestinal- soft, nontender, and active bowel sounds ? ? ?Assessment/Plan: ?1 Day Post-Op Procedure(s) (LRB): ?COMPUTER ASSISTED TOTAL KNEE ARTHROPLASTY  (Left) ?Principal Problem: ?  Total knee replacement status ? ?Estimated body mass index is 38.41 kg/m? as calculated from the following: ?  Height as of this encounter: '5\' 2"'$  (1.575 m). ?  Weight as of this encounter: 95.3 kg. ?Advance diet ?Up with therapy ? ? ? ?Post-op dressing removed. , Hemovac removed., and Mini compression dressing applied.  ? ?DVT Prophylaxis - Lovenox, Ted hose, and SCDs ?Weight-Bearing as tolerated to left leg ? ?Cassell Smiles, PA-C ?[facility] ?02/11/2022, 7:54 AM ? ?

## 2022-02-11 NOTE — Evaluation (Signed)
Occupational Therapy Evaluation ?Patient Details ?Name: Annette Bradford ?MRN: [id number] ?DOB: 1966/03/11 ?Today's Date: 02/11/2022 ? ? ?History of Present Illness Annette Bradford is s/p elective L TKA on 02/10/22.  ? ?Clinical Impression ?  ?Patient presenting with decreased Ind in self care, balance, functional mobility/transfers, endurance, and safety awareness. Patient reports being independent at baseline and living with spouse. She works as a Quarry manager at [employer]. OT educated pt on polar care and how to increase independence with LB self care.  Patient currently functioning supervision level with RW. She was able to don clothing and don/doff polar care after education without physical assistance. Pt does not need skilled OT intervention at this time. OT to SIGN OFF. ?   ? ?Recommendations for follow up therapy are one component of a multi-disciplinary discharge planning process, led by the attending physician.  Recommendations may be updated based on patient status, additional functional criteria and insurance authorization.  ? ?Follow Up Recommendations ? No OT follow up  ?  ?Assistance Recommended at Discharge PRN  ?   ?   ?Equipment Recommendations ? Other (comment) (Has all needed equipment)  ?  ?   ?Precautions / Restrictions Precautions ?Precautions: Knee ?Restrictions ?Weight Bearing Restrictions: Yes ?LLE Weight Bearing: Weight bearing as tolerated  ? ?  ? ?Mobility Bed Mobility ?Overal bed mobility: Modified Independent ?  ?  ?  ?  ?  ?  ?  ?  ? ?Transfers ?Overall transfer level: Needs assistance ?Equipment used: Rolling walker (2 wheels) ?Transfers: Sit to/from Stand ?Sit to Stand: Supervision ?  ?  ?  ?  ?  ?  ?  ? ?  ?Balance Overall balance assessment: Needs assistance ?Sitting-balance support: No upper extremity supported, Feet supported ?Sitting balance-Leahy Scale: Good ?  ?  ?Standing balance support: Bilateral upper extremity supported, During functional activity, Reliant on assistive device  for balance ?Standing balance-Leahy Scale: Good ?  ?  ?  ?  ?  ?  ?  ?  ?  ?  ?  ?  ?   ? ?ADL either performed or assessed with clinical judgement  ? ?ADL   ?  ?  ?  ?  ?  ?  ?  ?  ?  ?  ?  ?  ?  ?  ?  ?  ?  ?  ?  ?General ADL Comments: supervision overall. Pt demonstrates ability to don/doff L sock without assist while seated on EOB. She dons pull over gown without assistance.  ? ? ? ?Vision Patient Visual Report: No change from baseline ?   ?   ?   ?   ? ?Pertinent Vitals/Pain Pain Assessment ?Pain Assessment: Faces ?Faces Pain Scale: Hurts a little bit ?Pain Location: L knee ?Pain Descriptors / Indicators: Sore ?Pain Intervention(s): Monitored during session, Premedicated before session, Repositioned, Ice applied  ? ? ? ?Hand Dominance Right ?  ?Extremity/Trunk Assessment Upper Extremity Assessment ?Upper Extremity Assessment: Overall WFL for tasks assessed ?  ?Lower Extremity Assessment ?Lower Extremity Assessment: Defer to PT evaluation ?  ?  ?  ?Communication Communication ?Communication: No difficulties ?  ?Cognition Arousal/Alertness: Awake/alert ?Behavior During Therapy: [facility] for tasks assessed/performed ?Overall Cognitive Status: Within Functional Limits for tasks assessed ?  ?  ?  ?  ?  ?  ?  ?  ?  ?  ?  ?  ?  ?  ?  ?  ?General Comments: Pleasant and cooperative ?  ?  ?   ?   ?   ? ? ?  Home Living Family/patient expects to be discharged to:: Private residence ?Living Arrangements: Spouse/significant other ?Available Help at Discharge: Family;Available 24 hours/day ?Type of Home: House ?Home Access: Stairs to enter ?Entrance Stairs-Number of Steps: 2 ?Entrance Stairs-Rails: None ?Home Layout: Two level;Able to live on main level with bedroom/bathroom ?Alternate Level Stairs-Number of Steps: Flight ?  ?Bathroom Shower/Tub: Tub/shower unit ?  ?Bathroom Toilet: Standard ?  ?  ?Home Equipment: Conservation officer, nature (2 wheels);Hospital bed;Wheelchair - manual;BSC/3in1 ?  ?  ?  ? ?  ?Prior Functioning/Environment Prior  Level of Function : Independent/Modified Independent ?  ?  ?  ?  ?  ?  ?  ?ADLs Comments: Pt works full time as Quarry manager at [employer] ?  ? ?  ?  ?   ?   ?   ?OT Goals(Current goals can be found in the care plan section) Acute Rehab OT Goals ?Patient Stated Goal: to go home ?OT Goal Formulation: With patient ?Time For Goal Achievement: 02/11/22 ?Potential to Achieve Goals: Good  ?OT Frequency:   ?  ? ?   ?AM-PAC OT "6 Clicks" Daily Activity     ?Outcome Measure Help from another person eating meals?: None ?Help from another person taking care of personal grooming?: None ?Help from another person toileting, which includes using toliet, bedpan, or urinal?: None ?Help from another person bathing (including washing, rinsing, drying)?: None ?Help from another person to put on and taking off regular upper body clothing?: None ?Help from another person to put on and taking off regular lower body clothing?: None ?6 Click Score: 24 ?  ?End of Session Equipment Utilized During Treatment: Rolling walker (2 wheels) ?Nurse Communication: Mobility status ? ?Activity Tolerance: Patient tolerated treatment well ?Patient left: in chair;with chair alarm set ? ?   ?              ?Time: 7893-8101 ?OT Time Calculation (min): 19 min ?Charges:  OT General Charges ?$OT Visit: 1 Visit ?OT Evaluation ?$OT Eval Low Complexity: 1 Low ?OT Treatments ?$Self Care/Home Management : 8-22 mins ? ?Darleen Crocker, [place], OTR/L , CBIS ?ascom [phone]  ?02/11/22, 12:41 PM  ?

## 2022-02-11 NOTE — Progress Notes (Signed)
Physical Therapy Treatment ?Patient Details ?Name: Annette Bradford ?MRN: [id number] ?DOB: [birth year] ?Today's Date: 02/11/2022 ? ? ?History of Present Illness Annette Bradford is s/p elective L TKA on 02/10/22. ? ?  ?PT Comments  ? ? Pt upright in recliner agreeable to PT treatment. Education and Hep provided to pt performing all LE exercises with min VC's for form/technique with excellent carryover.  Education on reps, sets, frequency. Pt supervision to stand to RW and gait >200' with step through pattern and consistent LLE heel strike post VC's. Trialed asc/desc x1 step with RW for safe entering and exiting home. Pt performed with excellent balance and strength with minimal VC's for RW placement. Pt returned to seated in recliner all needs in reach. Pt safe to d/c home with [facility] PT.  ?  ?Recommendations for follow up therapy are one component of a multi-disciplinary discharge planning process, led by the attending physician.  Recommendations may be updated based on patient status, additional functional criteria and insurance authorization. ? ?Follow Up Recommendations ? Home health PT ?  ?  ?Assistance Recommended at Discharge PRN  ?Patient can return home with the following A little help with walking and/or transfers;A little help with bathing/dressing/bathroom;Assistance with cooking/housework;Assist for transportation;Help with stairs or ramp for entrance ?  ?Equipment Recommendations ?    ?  ?Recommendations for Other Services   ? ? ?  ?Precautions / Restrictions Precautions ?Precautions: Knee ?Precaution Booklet Issued: Yes (comment) ?Restrictions ?Weight Bearing Restrictions: Yes ?LLE Weight Bearing: Weight bearing as tolerated  ?  ? ?Mobility ? Bed Mobility ?  ?  ?  ?  ?  ?  ?  ?General bed mobility comments: NT. In recliner pre and post session ?Patient Response: Cooperative ? ?Transfers ?Overall transfer level: Needs assistance ?Equipment used: Rolling walker (2 wheels) ?Transfers: Sit to/from Stand ?Sit  to Stand: Modified independent (Device/Increase time) ?  ?  ?  ?  ?  ?General transfer comment: VC's for hand placement ?  ? ?Ambulation/Gait ?Ambulation/Gait assistance: Supervision ?Gait Distance (Feet): 250 Feet ?Assistive device: Rolling walker (2 wheels) ?Gait Pattern/deviations: Step-through pattern, Decreased stance time - left, Decreased step length - right ?  ?  ?  ?General Gait Details: Able to perform consistent heel strike with VC's, step through gait. ? ? ?Stairs ?Stairs: Yes ?Stairs assistance: Supervision ?Stair Management: Forwards, With walker ?Number of Stairs: 1 ?General stair comments: VC's for sequencing. Good balance and safe completion ? ? ?Wheelchair Mobility ?  ? ?Modified Rankin (Stroke Patients Only) ?  ? ? ?  ?Balance Overall balance assessment: Needs assistance ?Sitting-balance support: No upper extremity supported, Feet supported ?Sitting balance-Leahy Scale: Good ?  ?  ?Standing balance support: Bilateral upper extremity supported, During functional activity, Reliant on assistive device for balance ?Standing balance-Leahy Scale: Good ?  ?  ?  ?  ?  ?  ?  ?  ?  ?  ?  ?  ?  ? ?  ?Cognition Arousal/Alertness: Awake/alert ?Behavior During Therapy: [facility] for tasks assessed/performed ?Overall Cognitive Status: Within Functional Limits for tasks assessed ?  ?  ?  ?  ?  ?  ?  ?  ?  ?  ?  ?  ?  ?  ?  ?  ?General Comments: Pleasant and cooperative ?  ?  ? ?  ?Exercises Total Joint Exercises ?Ankle Circles/Pumps: AROM, Both, 10 reps, Supine ?Quad Sets: AROM, Strengthening, Left, 10 reps, Supine ?Short Arc Quad: AROM, Strengthening, Left, 10 reps, Supine ?Heel Slides:  AAROM, Left, 10 reps, Supine ?Hip ABduction/ADduction: AROM, Strengthening, Left, 10 reps, Supine ?Straight Leg Raises: AROM, Strengthening, Left, Supine, 10 reps ?Long Arc Quad: AROM, Strengthening, Left, 10 reps, Seated ?Other Exercises ?Other Exercises: Education on car transfer, LLE positioning, HEP reps, sets, frequency ? ?   ?General Comments   ?  ?  ? ?Pertinent Vitals/Pain Pain Assessment ?Pain Assessment: Faces ?Faces Pain Scale: Hurts a little bit ?Pain Location: L knee ?Pain Descriptors / Indicators: Sore ?Pain Intervention(s): Repositioned, Monitored during session  ? ? ?Home Living Family/patient expects to be discharged to:: Private residence ?Living Arrangements: Spouse/significant other ?Available Help at Discharge: Family;Available 24 hours/day ?Type of Home: House ?Home Access: Stairs to enter ?Entrance Stairs-Rails: None ?Entrance Stairs-Number of Steps: 2 ?Alternate Level Stairs-Number of Steps: Flight ?Home Layout: Two level;Able to live on main level with bedroom/bathroom ?Home Equipment: Conservation officer, nature (2 wheels);Hospital bed;Wheelchair - manual;BSC/3in1 ?   ?  ?Prior Function    ?  ?  ?   ? ?PT Goals (current goals can now be found in the care plan section) Acute Rehab PT Goals ?Patient Stated Goal: to go home ?PT Goal Formulation: With patient ?Time For Goal Achievement: 02/24/22 ?Potential to Achieve Goals: Good ?Progress towards PT goals: Progressing toward goals ? ?  ?Frequency ? ? ? BID ? ? ? ?  ?PT Plan Current plan remains appropriate  ? ? ?Co-evaluation   ?  ?  ?  ?  ? ?  ?AM-PAC PT "6 Clicks" Mobility   ?Outcome Measure ? Help needed turning from your back to your side while in a flat bed without using bedrails?: None ?Help needed moving from lying on your back to sitting on the side of a flat bed without using bedrails?: None ?Help needed moving to and from a bed to a chair (including a wheelchair)?: A Little ?Help needed standing up from a chair using your arms (e.g., wheelchair or bedside chair)?: A Little ?Help needed to walk in hospital room?: A Little ?Help needed climbing 3-5 steps with a railing? : A Little ?6 Click Score: 20 ? ?  ?End of Session Equipment Utilized During Treatment: Gait belt ?Activity Tolerance: Patient tolerated treatment well ?Patient left: in chair;with call bell/phone within  reach;with nursing/sitter in room;with family/visitor present ?Nurse Communication: Mobility status ?PT Visit Diagnosis: Other abnormalities of gait and mobility (R26.89);Muscle weakness (generalized) (M62.81) ?  ? ? ?Time: 5027-7412 ?PT Time Calculation (min) (ACUTE ONLY): 21 min ? ?Charges:  $Therapeutic Exercise: 8-22 mins          ?          ? ?Salem Caster. Fairly IV, PT, DPT ?Physical Therapist- [place]  ?[facility]  ?02/11/2022, 11:16 AM ? ?

## 2022-02-11 NOTE — Discharge Summary (Signed)
Physician Discharge Summary  ?Patient ID: ?Annette Bradford ?MRN: [id number] ?DOB/AGE: Nov 11, 1965 56 y.o. ? ?Admit date: 02/10/2022 ?Discharge date: 02/11/2022 ? ?Admission Diagnoses:  ?Total knee replacement status [Z96.659] ? ?Surgeries:Procedure(s): ?Left total knee arthroplasty using computer-assisted navigation ?  ?SURGEON:  Marciano Sequin. M.D. ?  ?ASSISTANT: Cassell Smiles, PA-C (present and scrubbed throughout the case, critical for assistance with exposure, retraction, instrumentation, and closure) ?  ?ANESTHESIA: spinal ?  ?ESTIMATED BLOOD LOSS: 50 mL ?  ?FLUIDS REPLACED: 1300 mL of crystalloid ?  ?TOURNIQUET TIME: 93 minutes ?  ?DRAINS: 2 medium Hemovac drains ?  ?SOFT TISSUE RELEASES: Anterior cruciate ligament, posterior cruciate ligament, deep medial collateral ligament, patellofemoral ligament ?  ?IMPLANTS UTILIZED: DePuy Attune size 4 posterior stabilized femoral component (cemented), size 4 rotating platform tibial component (cemented), 32 mm medialized dome patella (cemented), and a 5 mm stabilized rotating platform polyethylene insert. ? ?Discharge Diagnoses: ?Patient Active Problem List  ? Diagnosis Date Noted  ? Total knee replacement status 02/10/2022  ? Primary osteoarthritis of left knee 12/27/2021  ? Primary osteoarthritis of right knee 12/27/2021  ? Annual physical exam 07/28/2021  ? Vaginal itching 07/28/2021  ? Colon cancer screening 07/28/2021  ? Class 2 severe obesity due to excess calories with serious comorbidity and body mass index (BMI) of 38.0 to 38.9 in adult [facility]) 07/14/2020  ? ? ?Past Medical History:  ?Diagnosis Date  ? Abnormal liver enzymes 04/16/2015  ? Avitaminosis D 04/16/2015  ? Benign neoplasm of descending colon   ? Benign neoplasm of sigmoid colon   ? Bilateral primary osteoarthritis of knee 07/14/2020  ? Borderline diabetes 04/16/2015  ? Calcium blood increased 04/16/2015  ? Fibroid 04/16/2015  ? Hypercholesterolemia with hypertriglyceridemia 04/23/2016  ? Obesity (BMI  30-39.9)   ? Post menopausal syndrome 04/16/2015  ? Pre-diabetes   ? Rectal polyp   ? ?  ?Transfusion:  ?  ?Consultants (if any):  ? ?Discharged Condition: Improved ? ?Hospital Course: Annette Bradford is an 56 y.o. female who was admitted 02/10/2022 with a diagnosis of left knee osteoarthritis and went to the operating room on 02/10/2022 and underwent left total knee arthoplasty. The patient received perioperative antibiotics for prophylaxis (see below). The patient tolerated the procedure well and was transported to PACU in stable condition. After meeting PACU criteria, the patient was subsequently transferred to the Orthopaedics/Rehabilitation unit.  ? ?The patient received DVT prophylaxis in the form of early mobilization, Lovenox, TED hose, and SCDs . A sacral pad had been placed and heels were elevated off of the bed with rolled towels in order to protect skin integrity. Foley catheter was discontinued on postoperative day #0. Wound drains were discontinued on postoperative day #1. The surgical incision was healing well without signs of infection. ? ?Physical therapy was initiated postoperatively for transfers, gait training, and strengthening. Occupational therapy was initiated for activities of daily living and evaluation for assisted devices. Rehabilitation goals were reviewed in detail with the patient. The patient made steady progress with physical therapy and physical therapy recommended discharge to Home.  ? ?The patient achieved the preliminary goals of this hospitalization and was felt to be medically and orthopaedically appropriate for discharge. ? ?She was given perioperative antibiotics:  ?Anti-infectives (From admission, onward)  ? ? Start     Dose/Rate Route Frequency Ordered Stop  ? 02/10/22 1330  ceFAZolin (ANCEF) IVPB 2g/100 mL premix       ? 2 g ?200 mL/hr over 30 Minutes Intravenous Every 6 hours  02/10/22 1250 02/10/22 2050  ? 02/10/22 0646  vancomycin (VANCOCIN) 1-5 GM/200ML-% IVPB        ?Note to Pharmacy: Jordan Hawks H: cabinet override  ?    02/10/22 0646 02/10/22 0735  ? 02/10/22 0639  ceFAZolin (ANCEF) 2-4 GM/100ML-% IVPB       ?Note to Pharmacy: Jordan Hawks H: cabinet override  ?    02/10/22 0639 02/10/22 0740  ? 02/10/22 0600  ceFAZolin (ANCEF) IVPB 2g/100 mL premix       ? 2 g ?200 mL/hr over 30 Minutes Intravenous On call to O.R. 02/09/22 2323 02/10/22 0808  ? 02/09/22 2330  vancomycin (VANCOCIN) IVPB 1000 mg/200 mL premix       ? 1,000 mg ?200 mL/hr over 60 Minutes Intravenous  Once 02/09/22 2323 02/10/22 0809  ? ?  ?. ? ?Recent vital signs:  ?Vitals:  ? 02/11/22 0400 02/11/22 0726  ?BP: 114/76 111/80  ?Pulse: 96 85  ?Resp: 18 18  ?Temp: (!) 96.9 ?F (36.1 ?C) (!) 97 ?F (36.1 ?C)  ?SpO2: 94% 96%  ? ? ?Recent laboratory studies:  ?No results for input(s): WBC, HGB, HCT, PLT, K, CL, CO2, BUN, CREATININE, GLUCOSE, CALCIUM, LABPT, INR in the last 72 hours. ? ?Diagnostic Studies: DG Knee Left Port ? ?Result Date: 02/10/2022 ?CLINICAL DATA:  Post LEFT total knee arthroplasty EXAM: PORTABLE LEFT KNEE - 1-2 VIEW COMPARISON:  Portable exam 1129 hours without priors for comparison FINDINGS: Two surgical drains present. Osseous mineralization low normal. Components of a LEFT knee prosthesis are identified. No fracture, dislocation, or bone destruction. IMPRESSION: Postsurgical changes of LEFT total knee arthroplasty. No acute abnormalities. Electronically Signed   By: Lavonia Dana M.D.   On: 02/10/2022 11:36   ? ?Discharge Medications:   ?Allergies as of 02/11/2022   ?No Known Allergies ?  ? ?  ?Medication List  ?  ? ?STOP taking these medications   ? ?aspirin 81 MG tablet ?  ? ?  ? ?TAKE these medications   ? ?celecoxib 200 MG capsule ?Commonly known as: CELEBREX ?Take 1 capsule (200 mg total) by mouth 2 (two) times daily. ?What changed:  ?medication strength ?how much to take ?  ?enoxaparin 40 MG/0.4ML injection ?Commonly known as: LOVENOX ?Inject 0.4 mLs (40 mg total) into the skin daily  for 14 days. ?  ?OSTEO BI-FLEX REGULAR STRENGTH PO ?Take 1 tablet by mouth daily. ?  ?oxyCODONE 5 MG immediate release tablet ?Commonly known as: Oxy IR/ROXICODONE ?Take 1 tablet (5 mg total) by mouth every 4 (four) hours as needed for severe pain. ?  ?traMADol 50 MG tablet ?Commonly known as: ULTRAM ?Take 1 tablet (50 mg total) by mouth every 4 (four) hours as needed for moderate pain. ?  ?triamcinolone ointment 0.5 % ?Commonly known as: KENALOG ?Apply 1 application topically 2 (two) times daily. ?  ?VISINE-A OP ?Place 1 drop into both eyes daily as needed (eye irritation). ?  ? ?  ? ?  ?  ? ? ?  ?Durable Medical Equipment  ?(From admission, onward)  ?  ? ? ?  ? ?  Start     Ordered  ? 02/10/22 1556  For home use only DME Other see comment  Once       ?Comments: Civil engineer, contracting  ?Question:  Length of Need  Answer:  12 Months  ? 02/10/22 1556  ? 02/10/22 1251  DME Walker rolling  Once       ?Question:  Patient needs a walker  to treat with the following condition  Answer:  Total knee replacement status  ? 02/10/22 1250  ? 02/10/22 1251  DME Bedside commode  Once       ?Question:  Patient needs a bedside commode to treat with the following condition  Answer:  Total knee replacement status  ? 02/10/22 1250  ? ?  ?  ? ?  ? ? ?Disposition: Home with home health PT ? ? ? ? Follow-up Information   ? ? Tamala Julian B, PA-C Follow up on 02/25/2022.   ?Specialty: Orthopedic Surgery ?Why: at 8:45am ?Contact information: ?[address] ?[facility] ?[place] [address] ?[phone] ? ? ?  ?  ? ? Dereck Leep, MD Follow up on 03/25/2022.   ?Specialty: Orthopedic Surgery ?Why: at 1:45pm ?Contact information: ?[address] ?[place] [address] ?[phone] ? ? ?  ?  ? ?  ?  ? ?  ? ? ? ?Tamala Julian, PA-C ?02/11/2022, 8:00 AM ? ? ? ? ? ? ?

## 2022-02-12 NOTE — Anesthesia Postprocedure Evaluation (Signed)
Anesthesia Post Note ? ?Patient: Annette Bradford ? ?Procedure(s) Performed: COMPUTER ASSISTED TOTAL KNEE ARTHROPLASTY (Left: Knee) ? ?Patient location during evaluation: PACU ?Anesthesia Type: Spinal ?Level of consciousness: awake and alert ?Pain management: pain level controlled ?Vital Signs Assessment: post-procedure vital signs reviewed and stable ?Respiratory status: spontaneous breathing, nonlabored ventilation, respiratory function stable and patient connected to nasal cannula oxygen ?Cardiovascular status: blood pressure returned to baseline and stable ?Postop Assessment: no apparent nausea or vomiting ?Anesthetic complications: no ? ? ?No notable events documented. ? ? ?Last Vitals:  ?Vitals:  ? 02/11/22 0400 02/11/22 0726  ?BP: 114/76 111/80  ?Pulse: 96 85  ?Resp: 18 18  ?Temp: (!) 36.1 ?C (!) 36.1 ?C  ?SpO2: 94% 96%  ?  ?Last Pain:  ?Vitals:  ? 02/11/22 0902  ?TempSrc:   ?PainSc: 2   ? ? ?  ?  ?  ?  ?  ?  ? ?Molli Barrows ? ? ? ? ?

## 2022-02-17 ENCOUNTER — Telehealth: Payer: Self-pay

## 2022-02-17 NOTE — Telephone Encounter (Signed)
Copied from [place] [id number]. Topic: Appointment Scheduling - Scheduling Inquiry for Clinic ?>> Feb 17, 2022 11:07 AM Tessa Lerner A wrote: ?Reason for CRM: The patient would like to receive their pneumonia, shingles and MRSA shots  ? ?The patient would first like to have the shots availability confirmed before scheduling  ? ?Please contact further when possible ?

## 2022-02-17 NOTE — Telephone Encounter (Signed)
Patient has been scheduled for shingles vaccine 03/03/22. Patient advised that she is not due for pneumonia vaccine. And there is no vaccine for MRSA. 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 Patient wold like to know if she needs to be tested for this and is requesting more information on MRSA and being a carrier. Please advise.  ?

## 2022-02-18 NOTE — Telephone Encounter (Signed)
Patient advised.

## 2022-03-03 ENCOUNTER — Ambulatory Visit: Payer: Commercial Managed Care - PPO | Admitting: Family Medicine

## 2022-06-09 ENCOUNTER — Telehealth: Payer: Self-pay

## 2022-06-09 NOTE — Telephone Encounter (Signed)
Copied from [place] [phone]. Topic: Appointment Scheduling - Scheduling Inquiry for Clinic >> Jun 09, 2022 11:09 AM Leitha Schuller wrote: Patient called to schedule cpe for 9-6, after scheduling appt,  realized patient could not have cpe until after 9-27  Attemped to call patient, call was unsuccessful, LDVM for patient to call office back. Patient has been r/s for 9-28 @ 10:40

## 2022-07-07 ENCOUNTER — Encounter: Payer: Commercial Managed Care - PPO | Admitting: Family Medicine

## 2022-07-26 NOTE — Progress Notes (Unsigned)
Complete physical exam   Patient: Annette Bradford   DOB: 1966/10/09   56 y.o. Female  MRN: [id number] Visit Date: 07/29/2022  Today's healthcare provider: Gwyneth Sprout, FNP  Re Introduced to nurse practitioner role and practice setting.  All questions answered.  Discussed provider/patient relationship and expectations.   I,Shamara Soza J Alayla Dethlefs,acting as a scribe for Gwyneth Sprout, FNP.,have documented all relevant documentation on the behalf of Gwyneth Sprout, FNP,as directed by  Gwyneth Sprout, FNP while in the presence of Gwyneth Sprout, FNP.   Chief Complaint  Patient presents with   Annual Exam   Subjective    Annette Bradford is a 56 y.o. female who presents today for a complete physical exam.  She reports consuming a general diet. Gym/ health club routine includes aerobics, walking, strength training. She generally feels well. She reports sleeping well. She does not have additional problems to discuss today.  HPI    Past Medical History:  Diagnosis Date   Abnormal liver enzymes 04/16/2015   Avitaminosis D 04/16/2015   Benign neoplasm of descending colon    Benign neoplasm of sigmoid colon    Bilateral primary osteoarthritis of knee 07/14/2020   Borderline diabetes 04/16/2015   Calcium blood increased 04/16/2015   Fibroid 04/16/2015   Hypercholesterolemia with hypertriglyceridemia 04/23/2016   Obesity (BMI 30-39.9)    Post menopausal syndrome 04/16/2015   Pre-diabetes    Rectal polyp    Past Surgical History:  Procedure Laterality Date   COLONOSCOPY WITH PROPOFOL N/A 12/26/2015   Procedure: COLONOSCOPY WITH PROPOFOL;  Surgeon: Lucilla Lame, MD;  Location: [facility];  Service: Endoscopy;  Laterality: N/A;   KNEE ARTHROPLASTY Left 02/10/2022   Procedure: COMPUTER ASSISTED TOTAL KNEE ARTHROPLASTY;  Surgeon: Dereck Leep, MD;  Location: [facility];  Service: Orthopedics;  Laterality: Left;   POLYPECTOMY  12/26/2015   Procedure: POLYPECTOMY;  Surgeon:  Lucilla Lame, MD;  Location: [facility];  Service: Endoscopy;;   Social History   Socioeconomic History   Marital status: Married    Spouse name: Not on file   Number of children: Not on file   Years of education: Not on file   Highest education level: Not on file  Occupational History   Not on file  Tobacco Use   Smoking status: Former    Packs/day: 0.25    Years: 3.00    Total pack years: 0.75    Types: Cigarettes    Quit date: 66    Years since quitting: 33.7   Smokeless tobacco: Never  Vaping Use   Vaping Use: Never used  Substance and Sexual Activity   Alcohol use: Yes    Alcohol/week: 0.0 standard drinks of alcohol    Comment: OCCASIONALLY 1-2 TIMES A MONTH   Drug use: No   Sexual activity: Not on file  Other Topics Concern   Not on file  Social History Narrative   Not on file   Social Determinants of Health   Financial Resource Strain: Not on file  Food Insecurity: Not on file  Transportation Needs: Not on file  Physical Activity: Not on file  Stress: Not on file  Social Connections: Not on file  Intimate Partner Violence: Not on file   Family Status  Relation Name Status   Mother  Alive   Father  Deceased at age 53   Sister  [place]   Son  Alive   Sister  Alive   Brother  Alive   Family History  Problem Relation Age of Onset   Diabetes Mother    Heart attack Father    Diabetes Sister    Hypertension Brother    Cancer Sister    No Known Allergies  Patient Care Team: Gwyneth Sprout, FNP as PCP - General (Family Medicine)   Medications: Outpatient Medications Prior to Visit  Medication Sig   aspirin 81 MG chewable tablet Chew 81 mg by mouth daily.   [DISCONTINUED] Glucosamine-Chondroitin (OSTEO BI-FLEX REGULAR STRENGTH PO) Take 1 tablet by mouth daily.   [DISCONTINUED] celecoxib (CELEBREX) 200 MG capsule Take 1 capsule (200 mg total) by mouth 2 (two) times daily.   [DISCONTINUED] enoxaparin (LOVENOX) 40 MG/0.4ML  injection Inject 0.4 mLs (40 mg total) into the skin daily for 14 days.   [DISCONTINUED] Naphazoline-Pheniramine (VISINE-A OP) Place 1 drop into both eyes daily as needed (eye irritation).   [DISCONTINUED] oxyCODONE (OXY IR/ROXICODONE) 5 MG immediate release tablet Take 1 tablet (5 mg total) by mouth every 4 (four) hours as needed for severe pain.   [DISCONTINUED] traMADol (ULTRAM) 50 MG tablet Take 1 tablet (50 mg total) by mouth every 4 (four) hours as needed for moderate pain.   [DISCONTINUED] triamcinolone ointment (KENALOG) 0.5 % Apply 1 application topically 2 (two) times daily.   No facility-administered medications prior to visit.    Review of Systems  All other systems reviewed and are negative.   Last CBC Lab Results  Component Value Date   WBC 6.4 01/28/2022   HGB 13.9 01/28/2022   HCT 41.0 01/28/2022   MCV 94.9 01/28/2022   MCH 32.2 01/28/2022   RDW 13.0 01/28/2022   PLT 226 27/04/2375   Last metabolic panel Lab Results  Component Value Date   GLUCOSE 87 01/28/2022   NA 138 01/28/2022   K 3.9 01/28/2022   CL 102 01/28/2022   CO2 27 01/28/2022   BUN 20 01/28/2022   CREATININE 0.62 01/28/2022   GFRNONAA >60 01/28/2022   CALCIUM 9.4 01/28/2022   PROT 7.9 01/28/2022   ALBUMIN 4.4 01/28/2022   LABGLOB 2.5 07/28/2021   AGRATIO 1.8 07/28/2021   BILITOT 0.5 01/28/2022   ALKPHOS 60 01/28/2022   AST 31 01/28/2022   ALT 30 01/28/2022   ANIONGAP 9 01/28/2022   Last lipids Lab Results  Component Value Date   CHOL 220 (H) 07/28/2021   HDL 65 07/28/2021   LDLCALC 125 (H) 07/28/2021   TRIG 174 (H) 07/28/2021   CHOLHDL 3.4 07/28/2021   Last hemoglobin A1c Lab Results  Component Value Date   HGBA1C 5.8 (H) 01/28/2022   Last thyroid functions Lab Results  Component Value Date   TSH 0.685 07/28/2021   Last vitamin D Lab Results  Component Value Date   VD25OH 47.3 07/14/2020      Objective     BP 123/80 (BP Location: Left Arm, Patient Position:  Sitting, Cuff Size: Normal)   Pulse 94   Temp 98.2 F (36.8 C)   Resp 16   Ht '5\' 2"'$  (1.575 m)   Wt 214 lb (97.1 kg)   SpO2 98%   BMI 39.14 kg/m  BP Readings from Last 3 Encounters:  07/29/22 123/80  02/11/22 111/80  01/28/22 111/70   Wt Readings from Last 3 Encounters:  07/29/22 214 lb (97.1 kg)  02/10/22 210 lb (95.3 kg)  01/28/22 212 lb 4.9 oz (96.3 kg)       Physical Exam Vitals and nursing note  reviewed.  Constitutional:      General: She is awake. She is not in acute distress.    Appearance: Normal appearance. She is well-developed and well-groomed. She is obese. She is not ill-appearing, toxic-appearing or diaphoretic.  HENT:     Head: Normocephalic and atraumatic.     Jaw: There is normal jaw occlusion. No trismus, tenderness, swelling or pain on movement.     Right Ear: Hearing, tympanic membrane, ear canal and external ear normal. There is no impacted cerumen.     Left Ear: Hearing, tympanic membrane, ear canal and external ear normal. There is no impacted cerumen.     Nose: Nose normal. No congestion or rhinorrhea.     Right Turbinates: Not enlarged, swollen or pale.     Left Turbinates: Not enlarged, swollen or pale.     Right Sinus: No maxillary sinus tenderness or frontal sinus tenderness.     Left Sinus: No maxillary sinus tenderness or frontal sinus tenderness.     Mouth/Throat:     Lips: Pink.     Mouth: Mucous membranes are moist. No injury.     Tongue: No lesions.     Pharynx: Oropharynx is clear. Uvula midline. No pharyngeal swelling, oropharyngeal exudate, posterior oropharyngeal erythema or uvula swelling.     Tonsils: No tonsillar exudate or tonsillar abscesses.  Eyes:     General: Lids are normal. Lids are everted, no foreign bodies appreciated. Vision grossly intact. Gaze aligned appropriately. No allergic shiner or visual field deficit.       Right eye: No discharge.        Left eye: No discharge.     Extraocular Movements: Extraocular  movements intact.     Conjunctiva/sclera: Conjunctivae normal.     Right eye: Right conjunctiva is not injected. No exudate.    Left eye: Left conjunctiva is not injected. No exudate.    Pupils: Pupils are equal, round, and reactive to light.  Neck:     Thyroid: No thyroid mass, thyromegaly or thyroid tenderness.     Vascular: No carotid bruit.     Trachea: Trachea normal.  Cardiovascular:     Rate and Rhythm: Normal rate and regular rhythm.     Pulses: Normal pulses.          Carotid pulses are 2+ on the right side and 2+ on the left side.      Radial pulses are 2+ on the right side and 2+ on the left side.       Dorsalis pedis pulses are 2+ on the right side and 2+ on the left side.       Posterior tibial pulses are 2+ on the right side and 2+ on the left side.     Heart sounds: Normal heart sounds, S1 normal and S2 normal. No murmur heard.    No friction rub. No gallop.  Pulmonary:     Effort: Pulmonary effort is normal. No respiratory distress.     Breath sounds: Normal breath sounds and air entry. No stridor. No wheezing, rhonchi or rales.  Chest:     Chest wall: No tenderness.  Abdominal:     General: Abdomen is flat. Bowel sounds are normal. There is no distension.     Palpations: Abdomen is soft. There is no mass.     Tenderness: There is no abdominal tenderness. There is no right CVA tenderness, left CVA tenderness, guarding or rebound.     Hernia: No hernia is present.  Genitourinary:  Comments: Exam deferred; denies complaints Musculoskeletal:        General: No swelling, tenderness, deformity or signs of injury. Normal range of motion.     Cervical back: Full passive range of motion without pain, normal range of motion and neck supple. No edema, rigidity or tenderness. No muscular tenderness.     Right lower leg: No edema.     Left lower leg: No edema.  Lymphadenopathy:     Cervical: No cervical adenopathy.     Right cervical: No superficial, deep or posterior  cervical adenopathy.    Left cervical: No superficial, deep or posterior cervical adenopathy.  Skin:    General: Skin is warm and dry.     Capillary Refill: Capillary refill takes less than 2 seconds.     Coloration: Skin is not jaundiced or pale.     Findings: No bruising, erythema, lesion or rash.  Neurological:     General: No focal deficit present.     Mental Status: She is alert and oriented to person, place, and time. Mental status is at baseline.     GCS: GCS eye subscore is 4. GCS verbal subscore is 5. GCS motor subscore is 6.     Sensory: Sensation is intact. No sensory deficit.     Motor: Motor function is intact. No weakness.     Coordination: Coordination is intact. Coordination normal.     Gait: Gait is intact. Gait normal.  Psychiatric:        Attention and Perception: Attention and perception normal.        Mood and Affect: Mood and affect normal.        Speech: Speech normal.        Behavior: Behavior normal. Behavior is cooperative.        Thought Content: Thought content normal.        Cognition and Memory: Cognition and memory normal.        Judgment: Judgment normal.      Last depression screening scores    07/29/2022   10:44 AM 07/28/2021    9:14 AM 07/14/2020   10:14 AM  PHQ 2/9 Scores  PHQ - 2 Score 0 0 0  PHQ- 9 Score 0 0    Last fall risk screening    07/29/2022   10:44 AM  Fall Risk   Falls in the past year? 0  Number falls in past yr: 0  Injury with Fall? 0  Risk for fall due to : No Fall Risks  Follow up Falls evaluation completed   Last Audit-C alcohol use screening    07/29/2022   10:44 AM  Alcohol Use Disorder Test (AUDIT)  1. How often do you have a drink containing alcohol? 1  2. How many drinks containing alcohol do you have on a typical day when you are drinking? 0  3. How often do you have six or more drinks on one occasion? 0  AUDIT-C Score 1   A score of 3 or more in women, and 4 or more in men indicates increased risk for  alcohol abuse, EXCEPT if all of the points are from question 1   No results found for any visits on 07/29/22.  Assessment & Plan    Routine Health Maintenance and Physical Exam  Exercise Activities and Dietary recommendations  Goals   None     Immunization History  Administered Date(s) Administered   Influenza-Unspecified 09/20/2017   Moderna Sars-Covid-2 Vaccination 11/12/2019, 12/10/2019, 09/12/2020   Td  07/07/1994, 06/27/2006, 11/22/2017   Tdap 06/27/2006    Health Maintenance  Topic Date Due   Zoster Vaccines- Shingrix (1 of 2) Never done   COVID-19 Vaccine (4 - Moderna series) 11/07/2020   COLONOSCOPY (Pts 45-86yr Insurance coverage will need to be confirmed)  12/25/2020   INFLUENZA VACCINE  01/30/2023 (Originally 06/01/2022)   MAMMOGRAM  12/14/2022   PAP SMEAR-Modifier  04/04/2024   TETANUS/TDAP  11/23/2027   Hepatitis C Screening  Completed   HIV Screening  Completed   HPV VACCINES  Aged Out    Discussed health benefits of physical activity, and encouraged her to engage in regular exercise appropriate for her age and condition.  Problem List Items Addressed This Visit       Other   Annual physical exam - Primary    UTD on dental and vision Orders placed for colon screening Got vaccines today Things to do to keep yourself healthy  - Exercise at least 30-45 minutes a day, 3-4 days a week.  - Eat a low-fat diet with lots of fruits and vegetables, up to 7-9 servings per day.  - Seatbelts can save your life. Wear them always.  - Smoke detectors on every level of your home, check batteries every year.  - Eye Doctor - have an eye exam every 1-2 years  - Safe sex - if you may be exposed to STDs, use a condom.  - Alcohol -  If you drink, do it moderately, less than 2 drinks per day.  - [place] Choose someone to speak for you if you are not able.  - Depression is common in our stressful world.If you're feeling down or losing interest in things  you normally enjoy, please come in for a visit.  - Violence - If anyone is threatening or hurting you, please call immediately.        Relevant Orders   Zoster Recombinant (Shingrix )   Comprehensive metabolic panel   Lipid Panel With LDL/HDL Ratio   Hemoglobin A1c   Ambulatory referral to Gastroenterology   CBC with Differential/Platelet   TSH + free T4   Colon cancer screening    Overdue for colon cancer screening; referral placed Denies any symptoms or changes at this time      Relevant Orders   Ambulatory referral to Gastroenterology   Encounter for screening mammogram for malignant neoplasm of breast    Due 12/2022 Due for screening for mammogram, denies breast concerns, provided with phone number to call and schedule appointment for mammogram. Encouraged to repeat breast cancer screening every 1-2 years.       Relevant Orders   MM 3D SCREEN BREAST BILATERAL   Need for influenza vaccination    Consented; VIS made available; no immediate side effects following administration; plan to repeat annually        Relevant Orders   Flu Vaccine QUAD 624moM (Fluarix, Fluzone & Alfiuria Quad PF)   Need for shingles vaccine    Consented; VIS made available; no immediate side effects following administration; plan to repeat in 3-6 months       Relevant Orders   Zoster Recombinant (Shingrix )   Status post total left knee replacement    Doing well; completed by Dr HoMarry Guanhas good range of motion. No edema or pain.         Return in about 1 year (around 07/30/2023) for annual examination.     I,Vonna KotykFNP, have reviewed all  documentation for this visit. The documentation on 07/29/22 for the exam, diagnosis, procedures, and orders are all accurate and complete.    Gwyneth Sprout, [place] [phone] (phone) [phone] (fax)  [place]

## 2022-07-29 ENCOUNTER — Encounter: Payer: Self-pay | Admitting: Family Medicine

## 2022-07-29 ENCOUNTER — Ambulatory Visit (INDEPENDENT_AMBULATORY_CARE_PROVIDER_SITE_OTHER): Payer: Commercial Managed Care - PPO | Admitting: Family Medicine

## 2022-07-29 VITALS — BP 123/80 | HR 94 | Temp 98.2°F | Resp 16 | Ht 62.0 in | Wt 214.0 lb

## 2022-07-29 DIAGNOSIS — Z1231 Encounter for screening mammogram for malignant neoplasm of breast: Secondary | ICD-10-CM | POA: Diagnosis not present

## 2022-07-29 DIAGNOSIS — Z96652 Presence of left artificial knee joint: Secondary | ICD-10-CM

## 2022-07-29 DIAGNOSIS — Z0000 Encounter for general adult medical examination without abnormal findings: Secondary | ICD-10-CM

## 2022-07-29 DIAGNOSIS — Z1211 Encounter for screening for malignant neoplasm of colon: Secondary | ICD-10-CM | POA: Diagnosis not present

## 2022-07-29 DIAGNOSIS — Z23 Encounter for immunization: Secondary | ICD-10-CM

## 2022-07-29 NOTE — Assessment & Plan Note (Signed)
Consented; VIS made available; no immediate side effects following administration; plan to repeat annually   

## 2022-07-29 NOTE — Assessment & Plan Note (Signed)
UTD on dental and vision Orders placed for colon screening Got vaccines today Things to do to keep yourself healthy  - Exercise at least 30-45 minutes a day, 3-4 days a week.  - Eat a low-fat diet with lots of fruits and vegetables, up to 7-9 servings per day.  - Seatbelts can save your life. Wear them always.  - Smoke detectors on every level of your home, check batteries every year.  - Eye Doctor - have an eye exam every 1-2 years  - Safe sex - if you may be exposed to STDs, use a condom.  - Alcohol -  If you drink, do it moderately, less than 2 drinks per day.  - [place]. Choose someone to speak for you if you are not able.  - Depression is common in our stressful world.If you're feeling down or losing interest in things you normally enjoy, please come in for a visit.  - Violence - If anyone is threatening or hurting you, please call immediately.

## 2022-07-29 NOTE — Assessment & Plan Note (Signed)
Due 12/2022 Due for screening for mammogram, denies breast concerns, provided with phone number to call and schedule appointment for mammogram. Encouraged to repeat breast cancer screening every 1-2 years.

## 2022-07-29 NOTE — Assessment & Plan Note (Signed)
Overdue for colon cancer screening; referral placed Denies any symptoms or changes at this time

## 2022-07-29 NOTE — Assessment & Plan Note (Signed)
Consented; VIS made available; no immediate side effects following administration; plan to repeat in 3-6 months

## 2022-07-29 NOTE — Assessment & Plan Note (Signed)
Doing well; completed by Dr Marry Guan; has good range of motion. No edema or pain.

## 2022-07-30 LAB — TSH+FREE T4
Free T4: 1.12 ng/dL (ref 0.82–1.77)
TSH: 0.794 u[IU]/mL (ref 0.450–4.500)

## 2022-07-30 LAB — COMPREHENSIVE METABOLIC PANEL
ALT: 29 IU/L (ref 0–32)
AST: 32 IU/L (ref 0–40)
Albumin/Globulin Ratio: 1.7 (ref 1.2–2.2)
Albumin: 4.7 g/dL (ref 3.8–4.9)
Alkaline Phosphatase: 72 IU/L (ref 44–121)
BUN/Creatinine Ratio: 24 — ABNORMAL HIGH (ref 9–23)
BUN: 18 mg/dL (ref 6–24)
Bilirubin Total: 0.4 mg/dL (ref 0.0–1.2)
CO2: 24 mmol/L (ref 20–29)
Calcium: 9.6 mg/dL (ref 8.7–10.2)
Chloride: 100 mmol/L (ref 96–106)
Creatinine, Ser: 0.74 mg/dL (ref 0.57–1.00)
Globulin, Total: 2.7 g/dL (ref 1.5–4.5)
Glucose: 83 mg/dL (ref 70–99)
Potassium: 4 mmol/L (ref 3.5–5.2)
Sodium: 139 mmol/L (ref 134–144)
Total Protein: 7.4 g/dL (ref 6.0–8.5)
eGFR: 94 mL/min/{1.73_m2} (ref >59)

## 2022-07-30 LAB — CBC WITH DIFFERENTIAL/PLATELET
Basophils Absolute: 0 10*3/uL (ref 0.0–0.2)
Basos: 0 %
EOS (ABSOLUTE): 0.1 10*3/uL (ref 0.0–0.4)
Eos: 2 %
Hematocrit: 38.7 % (ref 34.0–46.6)
Hemoglobin: 13.5 g/dL (ref 11.1–15.9)
Immature Grans (Abs): 0 10*3/uL (ref 0.0–0.1)
Immature Granulocytes: 0 %
Lymphocytes Absolute: 2.4 10*3/uL (ref 0.7–3.1)
Lymphs: 35 %
MCH: 32.5 pg (ref 26.6–33.0)
MCHC: 34.9 g/dL (ref 31.5–35.7)
MCV: 93 fL (ref 79–97)
Monocytes Absolute: 0.6 10*3/uL (ref 0.1–0.9)
Monocytes: 9 %
Neutrophils Absolute: 3.7 10*3/uL (ref 1.4–7.0)
Neutrophils: 54 %
Platelets: 236 10*3/uL (ref 150–450)
RBC: 4.15 x10E6/uL (ref 3.77–5.28)
RDW: 12.9 % (ref 11.7–15.4)
WBC: 6.8 10*3/uL (ref 3.4–10.8)

## 2022-07-30 LAB — LIPID PANEL WITH LDL/HDL RATIO
Cholesterol, Total: 248 mg/dL — ABNORMAL HIGH (ref 100–199)
HDL: 68 mg/dL (ref >39)
LDL Chol Calc (NIH): 158 mg/dL — ABNORMAL HIGH (ref 0–99)
LDL/HDL Ratio: 2.3 ratio (ref 0.0–3.2)
Triglycerides: 124 mg/dL (ref 0–149)
VLDL Cholesterol Cal: 22 mg/dL (ref 5–40)

## 2022-07-30 LAB — HEMOGLOBIN A1C
Est. average glucose Bld gHb Est-mCnc: 120 mg/dL
Hgb A1c MFr Bld: 5.8 % — ABNORMAL HIGH (ref 4.8–5.6)

## 2022-07-30 NOTE — Progress Notes (Signed)
Hi Annette Bradford,  A1c remains stable in pre-diabetic range. Continue to recommend balanced, lower carb meals. Smaller meal size, adding snacks. Choosing water as drink of choice and increasing purposeful exercise.  Cholesterol has increased. Fats were down; however, bad/LDL and total cholesterol were up. I recommend diet low in saturated fat and regular exercise - 30 min at least 5 times per week  The 10-year ASCVD risk score (Arnett DK, et al., 2019) is: 3.6%   Values used to calculate the score:     Age: 53 years     Sex: Female     Is Non-Hispanic African American: Yes     Diabetic: No     Tobacco smoker: No     Systolic Blood Pressure: 159 mmHg     Is BP treated: No     HDL Cholesterol: 68 mg/dL     Total Cholesterol: 248 mg/dL Heart attack and stroke risk is 4% estimated within the next 10 years which is low.   All other labs are normal and stable.  Take care, Gwyneth Sprout, [address] [place], [place] [address] [phone] (phone) [phone] (fax) [place]

## 2022-08-02 ENCOUNTER — Telehealth: Payer: Self-pay | Admitting: *Deleted

## 2022-08-02 ENCOUNTER — Other Ambulatory Visit: Payer: Self-pay | Admitting: *Deleted

## 2022-08-02 DIAGNOSIS — Z86010 Personal history of colonic polyps: Secondary | ICD-10-CM

## 2022-08-02 MED ORDER — NA SULFATE-K SULFATE-MG SULF 17.5-3.13-1.6 GM/177ML PO SOLN: 1.0000 | Freq: Once | ORAL | 0 refills | Status: AC

## 2022-08-02 NOTE — Telephone Encounter (Signed)
Gastroenterology Pre-Procedure Review  Request Date: 09/10/2022 Requesting Physician: Dr. Allen Norris  PATIENT REVIEW QUESTIONS: The patient responded to the following health history questions as indicated:    1. Are you having any GI issues? no 2. Do you have a personal history of Polyps? yes (last colonoscopy 12/26/2015) 3. Do you have a family history of Colon Cancer or Polyps? no 4. Diabetes Mellitus? no 5. Joint replacements in the past 12 months?yes (left knee replacement on 02/10/2022) 6. Major health problems in the past 3 months?no 7. Any artificial heart valves, MVP, or defibrillator?no    MEDICATIONS & ALLERGIES:    Patient reports the following regarding taking any anticoagulation/antiplatelet therapy:   Plavix, Coumadin, Eliquis, Xarelto, Lovenox, Pradaxa, Brilinta, or Effient? no Aspirin? yes (81 mg)  Patient confirms/reports the following medications:  Current Outpatient Medications  Medication Sig Dispense Refill   aspirin 81 MG chewable tablet Chew 81 mg by mouth daily.     No current facility-administered medications for this visit.    Patient confirms/reports the following allergies:  No Known Allergies  No orders of the defined types were placed in this encounter.   AUTHORIZATION INFORMATION Primary Insurance: 1D#: Group #:  Secondary Insurance: 1D#: Group #:  SCHEDULE INFORMATION: Date: 09/10/2022 Time: Location: [place]

## 2022-09-03 ENCOUNTER — Encounter: Payer: Self-pay | Admitting: Gastroenterology

## 2022-09-10 ENCOUNTER — Ambulatory Visit: Payer: Commercial Managed Care - PPO | Admitting: Anesthesiology

## 2022-09-10 ENCOUNTER — Other Ambulatory Visit: Payer: Self-pay

## 2022-09-10 ENCOUNTER — Ambulatory Visit
Admission: RE | Admit: 2022-09-10 | Discharge: 2022-09-10 | Disposition: A | Discharge status: Home / Self Care | Payer: Commercial Managed Care - PPO | Attending: Gastroenterology | Admitting: Gastroenterology

## 2022-09-10 ENCOUNTER — Encounter: Payer: Self-pay | Admitting: Gastroenterology

## 2022-09-10 ENCOUNTER — Encounter
Admission: RE | Disposition: A | Discharge status: Home / Self Care | Payer: Self-pay | Source: Home / Self Care | Attending: Gastroenterology

## 2022-09-10 DIAGNOSIS — Z860100 Personal history of colon polyps, unspecified: Secondary | ICD-10-CM

## 2022-09-10 DIAGNOSIS — Z87891 Personal history of nicotine dependence: Secondary | ICD-10-CM | POA: Diagnosis not present

## 2022-09-10 DIAGNOSIS — Z1211 Encounter for screening for malignant neoplasm of colon: Secondary | ICD-10-CM | POA: Insufficient documentation

## 2022-09-10 HISTORY — PX: COLONOSCOPY WITH PROPOFOL: SHX5780

## 2022-09-10 SURGERY — COLONOSCOPY WITH PROPOFOL
Anesthesia: General | Site: Rectum

## 2022-09-10 MED ORDER — STERILE WATER FOR IRRIGATION IR SOLN
Status: DC | PRN
  Administered 2022-09-10: 1

## 2022-09-10 MED ORDER — SODIUM CHLORIDE 0.9 % IV SOLN: INTRAVENOUS | Status: DC

## 2022-09-10 MED ORDER — LIDOCAINE HCL (CARDIAC) PF 100 MG/5ML IV SOSY
PREFILLED_SYRINGE | INTRAVENOUS | Status: DC | PRN
  Administered 2022-09-10: 40 mg via INTRAVENOUS

## 2022-09-10 MED ORDER — LACTATED RINGERS IV SOLN: INTRAVENOUS | Status: DC

## 2022-09-10 MED ORDER — PROPOFOL 10 MG/ML IV BOLUS
INTRAVENOUS | Status: DC | PRN
  Administered 2022-09-10: 50 mg via INTRAVENOUS
  Administered 2022-09-10: 100 mg via INTRAVENOUS
  Administered 2022-09-10: 50 mg via INTRAVENOUS

## 2022-09-10 MED ORDER — BUPIVACAINE HCL (PF) 0.5 % IJ SOLN: INTRAMUSCULAR | Status: DC | PRN

## 2022-09-10 MED ORDER — BUPIVACAINE LIPOSOME 1.3 % IJ SUSP: INTRAMUSCULAR | Status: DC | PRN

## 2022-09-10 SURGICAL SUPPLY — 21 items
CLIP HMST 235XBRD CATH ROT (MISCELLANEOUS) IMPLANT
CLIP RESOLUTION 360 11X235 (MISCELLANEOUS)
ELECT REM PT RETURN 9FT ADLT (ELECTROSURGICAL)
ELECTRODE REM PT RTRN 9FT ADLT (ELECTROSURGICAL) IMPLANT
FORCEPS BIOP RAD 4 LRG CAP 4 (CUTTING FORCEPS) IMPLANT
GOWN CVR UNV OPN BCK APRN NK (MISCELLANEOUS) ×4 IMPLANT
GOWN ISOL THUMB LOOP REG UNIV (MISCELLANEOUS) ×2
INJECTOR VARIJECT VIN23 (MISCELLANEOUS) IMPLANT
KIT DEFENDO VALVE AND CONN (KITS) IMPLANT
KIT PRC NS LF DISP ENDO (KITS) ×2 IMPLANT
KIT PROCEDURE OLYMPUS (KITS) ×1
MANIFOLD NEPTUNE II (INSTRUMENTS) ×2 IMPLANT
MARKER SPOT ENDO TATTOO 5ML (MISCELLANEOUS) IMPLANT
PROBE APC STR FIRE (PROBE) IMPLANT
RETRIEVER NET ROTH 2.5X230 LF (MISCELLANEOUS) IMPLANT
SNARE COLD EXACTO (MISCELLANEOUS) IMPLANT
SNARE SHORT THROW 13M SML OVAL (MISCELLANEOUS) IMPLANT
SNARE SNG USE RND 15MM (INSTRUMENTS) IMPLANT
TRAP ETRAP POLY (MISCELLANEOUS) IMPLANT
VARIJECT INJECTOR VIN23 (MISCELLANEOUS)
WATER STERILE IRR 250ML POUR (IV SOLUTION) ×2 IMPLANT

## 2022-09-10 NOTE — H&P (Signed)
Lucilla Lame, MD [facility] [address] [place], [place] [address] Phone:[phone] Fax : [phone]  Primary Care Physician:  Gwyneth Sprout, FNP Primary Gastroenterologist:  Dr. Allen Norris  Pre-Procedure History & Physical: HPI:  Annette Bradford is a 56 y.o. female is here for an colonoscopy.   Past Medical History:  Diagnosis Date   Abnormal liver enzymes 04/16/2015   Avitaminosis D 04/16/2015   Benign neoplasm of descending colon    Benign neoplasm of sigmoid colon    Bilateral primary osteoarthritis of knee 07/14/2020   Borderline diabetes 04/16/2015   Calcium blood increased 04/16/2015   Fibroid 04/16/2015   Hypercholesterolemia with hypertriglyceridemia 04/23/2016   Obesity (BMI 30-39.9)    Post menopausal syndrome 04/16/2015   Pre-diabetes    Rectal polyp     Past Surgical History:  Procedure Laterality Date   COLONOSCOPY WITH PROPOFOL N/A 12/26/2015   Procedure: COLONOSCOPY WITH PROPOFOL;  Surgeon: Lucilla Lame, MD;  Location: [facility];  Service: Endoscopy;  Laterality: N/A;   KNEE ARTHROPLASTY Left 02/10/2022   Procedure: COMPUTER ASSISTED TOTAL KNEE ARTHROPLASTY;  Surgeon: Dereck Leep, MD;  Location: [facility];  Service: Orthopedics;  Laterality: Left;   POLYPECTOMY  12/26/2015   Procedure: POLYPECTOMY;  Surgeon: Lucilla Lame, MD;  Location: [facility];  Service: Endoscopy;;    Prior to Admission medications   Medication Sig Start Date End Date Taking? Authorizing Provider  aspirin 81 MG chewable tablet Chew 81 mg by mouth daily. 07/29/22  Yes [provider]  glucosamine-chondroitin 500-400 MG tablet Take 1 tablet by mouth daily.   Yes [provider]    Allergies as of 08/02/2022   (No Known Allergies)    Family History  Problem Relation Age of Onset   Diabetes Mother    Heart attack Father    Diabetes Sister    Hypertension Brother    Cancer Sister     Social History   Socioeconomic History    Marital status: Married    Spouse name: Not on file   Number of children: Not on file   Years of education: Not on file   Highest education level: Not on file  Occupational History   Not on file  Tobacco Use   Smoking status: Former    Packs/day: 0.25    Years: 3.00    Total pack years: 0.75    Types: Cigarettes    Quit date: 1990    Years since quitting: 33.8   Smokeless tobacco: Never  Vaping Use   Vaping Use: Never used  Substance and Sexual Activity   Alcohol use: Yes    Alcohol/week: 0.0 standard drinks of alcohol    Comment: OCCASIONALLY 1-2 TIMES A MONTH   Drug use: No   Sexual activity: Not on file  Other Topics Concern   Not on file  Social History Narrative   Not on file   Social Determinants of Health   Financial Resource Strain: Not on file  Food Insecurity: Not on file  Transportation Needs: Not on file  Physical Activity: Not on file  Stress: Not on file  Social Connections: Not on file  Intimate Partner Violence: Not on file    Review of Systems: See HPI, otherwise negative ROS  Physical Exam: Ht '5\' 2"'$  (1.575 m)   Wt 90.7 kg   BMI 36.58 kg/m  General:   Alert,  pleasant and cooperative in NAD Head:  Normocephalic and atraumatic. Neck:  Supple; no masses or  thyromegaly. Lungs:  Clear throughout to auscultation.    Heart:  Regular rate and rhythm. Abdomen:  Soft, nontender and nondistended. Normal bowel sounds, without guarding, and without rebound.   Neurologic:  Alert and  oriented x4;  grossly normal neurologically.  Impression/Plan: EBONEY CLAYBROOK is here for an colonoscopy to be performed for a history of adenomatous polyps on 2017   Risks, benefits, limitations, and alternatives regarding  colonoscopy have been reviewed with the patient.  Questions have been answered.  All parties agreeable.   Lucilla Lame, MD  09/10/2022, 9:14 AM

## 2022-09-10 NOTE — Anesthesia Preprocedure Evaluation (Addendum)
Anesthesia Evaluation  Patient identified by MRN, date of birth, ID band Patient awake    Reviewed: Allergy & Precautions, H&P , NPO status , Patient's Chart, lab work & pertinent test results, reviewed documented beta blocker date and time   History of Anesthesia Complications Negative for: history of anesthetic complications  Airway Mallampati: II  TM Distance: >3 FB Neck ROM: full    Dental  (+) Poor Dentition   Pulmonary neg shortness of breath, neg COPD, neg recent URI, former smoker   Pulmonary exam normal        Cardiovascular Exercise Tolerance: Good (-) angina (-) Past MI, (-) Cardiac Stents and (-) CABG Normal cardiovascular exam Rhythm:regular Rate:Normal     Neuro/Psych negative neurological ROS  negative psych ROS   GI/Hepatic negative GI ROS, Neg liver ROS,,,  Endo/Other  negative endocrine ROS    Renal/GU negative Renal ROS  negative genitourinary   Musculoskeletal   Abdominal   Peds  Hematology negative hematology ROS (+)   Anesthesia Other Findings  Past Medical History: 04/16/2015: Abnormal liver enzymes 04/16/2015: Avitaminosis D No date: Benign neoplasm of descending colon No date: Benign neoplasm of sigmoid colon 07/14/2020: Bilateral primary osteoarthritis of knee 04/16/2015: Borderline diabetes 04/16/2015: Calcium blood increased 04/16/2015: Fibroid 04/23/2016: Hypercholesterolemia with hypertriglyceridemia No date: Obesity (BMI 30-39.9) 04/16/2015: Post menopausal syndrome No date: Pre-diabetes No date: Rectal polyp Past Surgical History: 12/26/2015: COLONOSCOPY WITH PROPOFOL; N/A     Comment:  Procedure: COLONOSCOPY WITH PROPOFOL;  Surgeon: Lucilla Lame, MD;  Location: [place];  Service:               Endoscopy;  Laterality: N/A; 12/26/2015: POLYPECTOMY     Comment:  Procedure: POLYPECTOMY;  Surgeon: Lucilla Lame, MD;                Location: [place];  Service: Endoscopy;; BMI    Body Mass Index: 38.41 kg/m     Reproductive/Obstetrics negative OB ROS                             Anesthesia Physical Anesthesia Plan  ASA: 2  Anesthesia Plan: General and Spinal   Post-op Pain Management:    Induction: Intravenous  PONV Risk Score and Plan: 4 or greater and Propofol infusion and TIVA  Airway Management Planned: Natural Airway and Nasal Cannula  Additional Equipment:   Intra-op Plan:   Post-operative Plan:   Informed Consent: I have reviewed the patients History and Physical, chart, labs and discussed the procedure including the risks, benefits and alternatives for the proposed anesthesia with the patient or authorized representative who has indicated his/her understanding and acceptance.     Dental Advisory Given  Plan Discussed with: CRNA  Anesthesia Plan Comments: (Patient consented for risks of anesthesia including but not limited to:  - adverse reactions to medications - risk of airway placement if required - damage to eyes, teeth, lips or other oral mucosa - nerve damage due to positioning  - sore throat or hoarseness - Damage to heart, brain, nerves, lungs, other parts of body or loss of life  Patient voiced understanding.)        Anesthesia Quick Evaluation

## 2022-09-10 NOTE — Anesthesia Postprocedure Evaluation (Signed)
Anesthesia Post Note  Patient: Annette Bradford  Procedure(s) Performed: COLONOSCOPY WITH PROPOFOL (Rectum)  Patient location during evaluation: PACU Anesthesia Type: Spinal Level of consciousness: awake and alert Pain management: pain level controlled Vital Signs Assessment: post-procedure vital signs reviewed and stable Respiratory status: spontaneous breathing, nonlabored ventilation, respiratory function stable and patient connected to nasal cannula oxygen Cardiovascular status: blood pressure returned to baseline and stable Postop Assessment: no apparent nausea or vomiting Anesthetic complications: no  No notable events documented.   Last Vitals:  Vitals:   09/10/22 1005 09/10/22 1015  BP: 101/73 100/77  Pulse: 88 83  Resp: (!) 30 (!) 21  Temp: (!) 36.3 C (!) 36.3 C  SpO2: 95% 95%    Last Pain:  Vitals:   09/10/22 1015  TempSrc:   PainSc: 0-No pain                 Dimas Millin

## 2022-09-10 NOTE — Transfer of Care (Signed)
Immediate Anesthesia Transfer of Care Note  Patient: Annette Bradford  Procedure(s) Performed: COLONOSCOPY WITH PROPOFOL (Rectum)  Patient Location: PACU  Anesthesia Type: General, Spinal  Level of Consciousness: awake, alert  and patient cooperative  Airway and Oxygen Therapy: Patient Spontanous Breathing and Patient connected to supplemental oxygen  Post-op Assessment: Post-op Vital signs reviewed, Patient's Cardiovascular Status Stable, Respiratory Function Stable, Patent Airway and No signs of Nausea or vomiting  Post-op Vital Signs: Reviewed and stable  Complications: No notable events documented.

## 2022-09-10 NOTE — Op Note (Addendum)
[facility] Patient Name: Annette Bradford Procedure Date: 09/10/2022 9:45 AM MRN: [id number] Account #: [id number] Date of Birth: 01-24-1966 Admit Type: Outpatient Age: 56 Room: [facility] OR ROOM 01 Gender: Female Note Status: Finalized Instrument Name: [id number] Procedure:             Colonoscopy Indications:           High risk colon cancer surveillance: Personal history                         of colonic polyps Providers:             Lucilla Lame MD, MD Referring MD:          Jaci Standard. Rollene Rotunda (Referring MD) Medicines:             Propofol per Anesthesia Complications:         No immediate complications. Procedure:             Pre-Anesthesia Assessment:                        - Prior to the procedure, a History and Physical was                         performed, and patient medications and allergies were                         reviewed. The patient's tolerance of previous                         anesthesia was also reviewed. The risks and benefits                         of the procedure and the sedation options and risks                         were discussed with the patient. All questions were                         answered, and informed consent was obtained. Prior                         Anticoagulants: The patient has taken no anticoagulant                         or antiplatelet agents. ASA Grade Assessment: II - A                         patient with mild systemic disease. After reviewing                         the risks and benefits, the patient was deemed in                         satisfactory condition to undergo the procedure.                        After obtaining informed consent, the colonoscope was  passed under direct vision. Throughout the procedure,                         the patient's blood pressure, pulse, and oxygen                         saturations were monitored continuously. The was                          introduced through the anus and advanced to the the                         cecum, identified by appendiceal orifice and ileocecal                         valve. The colonoscopy was performed without                         difficulty. The patient tolerated the procedure well.                         The quality of the bowel preparation was excellent. Findings:      The perianal and digital rectal examinations were normal.      Non-bleeding internal hemorrhoids were found during retroflexion. The       hemorrhoids were Grade I (internal hemorrhoids that do not prolapse). Impression:            - Non-bleeding internal hemorrhoids.                        - No specimens collected. Recommendation:        - Discharge patient to home.                        - Resume previous diet.                        - Continue present medications.                        - Repeat colonoscopy in 10 years for surveillance. Procedure Code(s):     --- Professional ---                        [phone], Colonoscopy, flexible; diagnostic, including                         collection of specimen(s) by brushing or washing, when                         performed (separate procedure) Diagnosis Code(s):     --- Professional ---                        Z86.010, Personal history of colonic polyps CPT copyright 2022 American Medical Association. All rights reserved. The codes documented in this report are preliminary and upon coder review may  be revised to meet current compliance requirements. Lucilla Lame MD, MD 09/10/2022 10:02:49 AM This report has been signed electronically. Number of Addenda: 0 Note Initiated On: 09/10/2022 9:45 AM Scope Withdrawal Time:  0 hours 7 minutes 16 seconds  Total Procedure Duration: 0 hours 10 minutes 33 seconds  Estimated Blood Loss:  Estimated blood loss: none.      [facility]

## 2022-09-13 ENCOUNTER — Encounter: Payer: Self-pay | Admitting: Gastroenterology

## 2022-10-06 ENCOUNTER — Ambulatory Visit (INDEPENDENT_AMBULATORY_CARE_PROVIDER_SITE_OTHER): Payer: Commercial Managed Care - PPO | Admitting: Family Medicine

## 2022-10-06 DIAGNOSIS — Z23 Encounter for immunization: Secondary | ICD-10-CM

## 2022-10-06 NOTE — Progress Notes (Signed)
Patient is here for second shingles vaccine.

## 2023-01-10 ENCOUNTER — Ambulatory Visit: Payer: Self-pay

## 2023-01-10 ENCOUNTER — Telehealth: Payer: Self-pay

## 2023-01-10 NOTE — Telephone Encounter (Signed)
Pt has an appt for tomorrow with Mikey Kirschner. Ria Comment is not her PCP. PT is concerned that she will be charged extra because Ria Comment is not her PCP.  Please advise pt with call back.

## 2023-01-10 NOTE — Telephone Encounter (Signed)
  Chief Complaint: Lump on back Symptoms: Lump size of a quarter - black in color, possible head, itchy Frequency: Had foe a long time - started growing last week Pertinent Negatives: Patient denies fever Disposition: [] ED /[] Urgent Care (no appt availability in office) / [x] Appointment(In office/virtual)/ []  [facility]/ [] Home Care/ [] Refused Recommended Disposition /[] [facility]/ []  Follow-up with PCP Additional Notes: Pt states that this lump has been there for awhile, but last week started getting larger. Husband states that it is now black in color and has a head.    Reason for Disposition  Looks like a boil, infected sore, deep ulcer or other infected rash  Answer Assessment - Initial Assessment Questions 1. APPEARANCE of SWELLING: "What does it look like?"     Lump 2. SIZE: "How large is the swelling?" (e.g., inches, cm; or compare to size of pinhead, tip of pen, eraser, coin, pea, grape, ping pong ball)      Quarter 3. LOCATION: "Where is the swelling located?"     Upper back 4. ONSET: "When did the swelling start?"     Las week 5. COLOR: "What color is it?" "Is there more than one color?"     Black - had a head yesteday 6. PAIN: "Is there any pain?" If Yes, ask: "How bad is the pain?" (e.g., scale 1-10; or mild, moderate, severe)     - NONE (0): no pain   - MILD (1-3): doesn't interfere with normal activities    - MODERATE (4-7): interferes with normal activities or awakens from sleep    - SEVERE (8-10): excruciating pain, unable to do any normal activities     2/10 7. ITCH: "Does it itch?" If Yes, ask: "How bad is the itch?"      Comes and goes 8. CAUSE: "What do you think caused the swelling?" 9 OTHER SYMPTOMS: "Do you have any other symptoms?" (e.g., fever)  Protocols used: Skin Lump or Localized Swelling-A-AH

## 2023-01-10 NOTE — Telephone Encounter (Signed)
Called pt and left message - ok to see Ria Comment for acute visit as we are all under the same tax ID.

## 2023-01-11 ENCOUNTER — Ambulatory Visit: Payer: Commercial Managed Care - PPO | Admitting: Physician Assistant

## 2023-01-11 ENCOUNTER — Encounter: Payer: Self-pay | Admitting: Physician Assistant

## 2023-01-11 VITALS — BP 114/80 | HR 87 | Ht 62.0 in | Wt 215.5 lb

## 2023-01-11 DIAGNOSIS — L0291 Cutaneous abscess, unspecified: Secondary | ICD-10-CM | POA: Diagnosis not present

## 2023-01-11 MED ORDER — LIDOCAINE HCL (PF) 1 % IJ SOLN: 2.0000 mL | Freq: Once | INTRAMUSCULAR | Status: DC

## 2023-01-11 MED ORDER — SULFAMETHOXAZOLE-TRIMETHOPRIM 800-160 MG PO TABS: 1.0000 | ORAL_TABLET | Freq: Two times a day (BID) | ORAL | 0 refills | Status: AC

## 2023-01-11 NOTE — Progress Notes (Signed)
I,Sha'taria Tyson,acting as a Education administrator for [employer], PA-C.,have documented all relevant documentation on the behalf of Mikey Kirschner, PA-C,as directed by  Mikey Kirschner, PA-C while in the presence of Mikey Kirschner, PA-C.   Established patient visit   Patient: Annette Bradford   DOB: 1966-09-29   57 y.o. Female  MRN: [id number] Visit Date: 01/11/2023  Today's healthcare provider: Mikey Kirschner, PA-C  Cc. Painful knot on back x 1 week  Subjective    HPI  Patient is being seen today for a knot on hr back X 1 week. Patient reports it is itching, burning and "unusual". Patient reports that her husband states it is bigger and patient describes it to be the size of a quarter when she looked in the mirror. NO fluid or discharge, patient reports her husband said it looks as if it is coming to a head. Associated with hardness and darkness.   Medications: Outpatient Medications Prior to Visit  Medication Sig   aspirin 81 MG chewable tablet Chew 81 mg by mouth daily.   glucosamine-chondroitin 500-400 MG tablet Take 1 tablet by mouth daily.   No facility-administered medications prior to visit.    Review of Systems  Constitutional:  Negative for fatigue and fever.  Respiratory:  Negative for cough and shortness of breath.   Cardiovascular:  Negative for chest pain and leg swelling.  Gastrointestinal:  Negative for abdominal pain.  Skin:  Positive for color change.  Neurological:  Negative for dizziness and headaches.      Objective    BP 114/80 (BP Location: Right Arm, Patient Position: Sitting, Cuff Size: Large)   Pulse 87   Ht '5\' 2"'$  (1.575 m)   Wt 215 lb 8 oz (97.8 kg)   SpO2 99%   BMI 39.42 kg/m   Physical Exam Vitals reviewed.  Constitutional:      Appearance: She is not ill-appearing.  HENT:     Head: Normocephalic.  Eyes:     Conjunctiva/sclera: Conjunctivae normal.  Cardiovascular:     Rate and Rhythm: Normal rate.  Pulmonary:     Effort: Pulmonary  effort is normal. No respiratory distress.  Skin:    Comments: Right thoracic back there is a 2-3 cm fluctuant tender mass  Neurological:     General: No focal deficit present.     Mental Status: She is alert and oriented to person, place, and time.  Psychiatric:        Mood and Affect: Mood normal.        Behavior: Behavior normal.      No results found for any visits on 01/11/23.  Assessment & Plan     Abscess Discussed at home treatment vs I&D today. Pt prefers I&D   The area was prepped with betadine, 2 m L of xylocaine was used to numb the area. A stab incision was placed at the most fluctuant area. A significant amount of purulent material was expressed. It was flushed with normal saline.  The abscess was dressed at pt was given instruction to use warm compresses, let hot water from shower run over it  Rx bactrim bid x 7 days  Return if symptoms worsen or fail to improve.      I, Mikey Kirschner, PA-C have reviewed all documentation for this visit. The documentation on  01/11/23 for the exam, diagnosis, procedures, and orders are all accurate and complete.  Mikey Kirschner, PA-C [facility] [address] [place], [place], [address] Office: [phone]  Fax: [place]

## 2023-08-02 ENCOUNTER — Ambulatory Visit: Payer: Commercial Managed Care - PPO | Admitting: Family Medicine

## 2023-08-02 ENCOUNTER — Encounter: Payer: Self-pay | Admitting: Family Medicine

## 2023-08-02 VITALS — BP 121/83 | HR 83 | Temp 98.4°F | Ht 62.0 in | Wt 213.0 lb

## 2023-08-02 DIAGNOSIS — R7303 Prediabetes: Secondary | ICD-10-CM

## 2023-08-02 DIAGNOSIS — E559 Vitamin D deficiency, unspecified: Secondary | ICD-10-CM | POA: Diagnosis not present

## 2023-08-02 DIAGNOSIS — Z23 Encounter for immunization: Secondary | ICD-10-CM | POA: Diagnosis not present

## 2023-08-02 DIAGNOSIS — Z0001 Encounter for general adult medical examination with abnormal findings: Secondary | ICD-10-CM

## 2023-08-02 DIAGNOSIS — Z0000 Encounter for general adult medical examination without abnormal findings: Secondary | ICD-10-CM

## 2023-08-02 NOTE — Progress Notes (Signed)
Complete physical exam  Patient: Annette Bradford   DOB: 1966/02/28   57 y.o. Female  MRN: [id number] Visit Date: 08/02/2023  Today's healthcare provider: Jacky Kindle, FNP  Re-introduced to nurse practitioner role and practice setting.  All questions answered.  Discussed provider/patient relationship and expectations.  Chief Complaint  Patient presents with   Annual Exam   Subjective    Annette Bradford is a 57 y.o. female who presents today for a complete physical exam.  She reports consuming a diabetic, more low carb than calorie diet. Gym/ health club routine includes jogging on track . She generally feels fairly well. She reports sleeping fairly well. She does have additional problems to discuss today.  HPI   Discussed the use of AI scribe software for clinical note transcription with the patient, who gave verbal consent to proceed.  History of Present Illness   The patient presents for an annual physical. She reports overall good health and has been proactive in maintaining her health. She has been exercising regularly at [employer], three to four days a week. She has also been mindful of her diet, cutting out sodas and sweets, and focusing on a low-carb diet. She has been drinking a lot of water and flavored water with no calories or sodium. She has also been taking care of her dental health, getting regular cleanings and addressing any cavities.  The patient has a history of knee problems and is planning to have a second knee surgery after January. She has also been dealing with fibroids, but reports no pain or bleeding associated with them. She is currently taking glucosamine, chondroitin, and low-dose aspirin.  The patient is planning a trip to [place] in February and is in the process of getting her passport and checking on necessary vaccines. She expresses some concern about potential health risks associated with the trip.     Past Medical History:  Diagnosis  Date   Abnormal liver enzymes 04/16/2015   Avitaminosis D 04/16/2015   Benign neoplasm of descending colon    Benign neoplasm of sigmoid colon    Bilateral primary osteoarthritis of knee 07/14/2020   Borderline diabetes 04/16/2015   Calcium blood increased 04/16/2015   Fibroid 04/16/2015   Hypercholesterolemia with hypertriglyceridemia 04/23/2016   Obesity (BMI 30-39.9)    Post menopausal syndrome 04/16/2015   Pre-diabetes    Rectal polyp    Past Surgical History:  Procedure Laterality Date   COLONOSCOPY WITH PROPOFOL N/A 12/26/2015   Procedure: COLONOSCOPY WITH PROPOFOL;  Surgeon: Midge Minium, MD;  Location: [facility];  Service: Endoscopy;  Laterality: N/A;   COLONOSCOPY WITH PROPOFOL N/A 09/10/2022   Procedure: COLONOSCOPY WITH PROPOFOL;  Surgeon: Midge Minium, MD;  Location: [facility];  Service: Endoscopy;  Laterality: N/A;   KNEE ARTHROPLASTY Left 02/10/2022   Procedure: COMPUTER ASSISTED TOTAL KNEE ARTHROPLASTY;  Surgeon: Donato Heinz, MD;  Location: [facility];  Service: Orthopedics;  Laterality: Left;   POLYPECTOMY  12/26/2015   Procedure: POLYPECTOMY;  Surgeon: Midge Minium, MD;  Location: [facility];  Service: Endoscopy;;   Social History   Socioeconomic History   Marital status: Married    Spouse name: Not on file   Number of children: Not on file   Years of education: Not on file   Highest education level: Not on file  Occupational History   Not on file  Tobacco Use   Smoking status: Former    Current packs/day: 0.00  Average packs/day: 0.3 packs/day for 3.0 years (0.8 ttl pk-yrs)    Types: Cigarettes    Start date: 2    Quit date: 81    Years since quitting: 34.7   Smokeless tobacco: Never  Vaping Use   Vaping status: Never Used  Substance and Sexual Activity   Alcohol use: Yes    Alcohol/week: 0.0 standard drinks of alcohol    Comment: OCCASIONALLY 1-2 TIMES A MONTH   Drug use: No   Sexual activity: Not on file   Other Topics Concern   Not on file  Social History Narrative   Not on file   Social Determinants of Health   Financial Resource Strain: Not on file  Food Insecurity: Not on file  Transportation Needs: Not on file  Physical Activity: Not on file  Stress: Not on file  Social Connections: Not on file  Intimate Partner Violence: Not on file   Family Status  Relation Name Status   Mother  Alive   Father  Deceased at age 35   Sister  Alive   Brother  Alive   Son  Alive   Sister  Alive   Brother  Alive  No partnership data on file   Family History  Problem Relation Age of Onset   Diabetes Mother    Heart attack Father    Diabetes Sister    Hypertension Brother    Cancer Sister    No Known Allergies  Patient Care Team: Merita Norton T, FNP as PCP - General (Family Medicine)   Medications: Outpatient Medications Prior to Visit  Medication Sig   aspirin 81 MG chewable tablet Chew 81 mg by mouth daily.   glucosamine-chondroitin 500-400 MG tablet Take 1 tablet by mouth daily.   Facility-Administered Medications Prior to Visit  Medication Dose Route Frequency Provider   lidocaine (PF) (XYLOCAINE) 1 % injection 2 mL  2 mL Intradermal Once Alfredia Ferguson, PA-C    Review of Systems    Objective    BP 121/83 (BP Location: Right Arm, Patient Position: Sitting, Cuff Size: Large)   Pulse 83   Temp 98.4 F (36.9 C) (Oral)   Ht 5\' 2"  (1.575 m)   Wt 213 lb (96.6 kg)   SpO2 98%   BMI 38.96 kg/m    Physical Exam Vitals and nursing note reviewed.  Constitutional:      General: She is awake. She is not in acute distress.    Appearance: Normal appearance. She is well-developed and well-groomed. She is obese. She is not ill-appearing, toxic-appearing or diaphoretic.  HENT:     Head: Normocephalic and atraumatic.     Jaw: There is normal jaw occlusion. No trismus, tenderness, swelling or pain on movement.     Right Ear: Hearing, tympanic membrane, ear canal and external  ear normal. There is no impacted cerumen.     Left Ear: Hearing, tympanic membrane, ear canal and external ear normal. There is no impacted cerumen.     Nose: Nose normal. No congestion or rhinorrhea.     Right Turbinates: Not enlarged, swollen or pale.     Left Turbinates: Not enlarged, swollen or pale.     Right Sinus: No maxillary sinus tenderness or frontal sinus tenderness.     Left Sinus: No maxillary sinus tenderness or frontal sinus tenderness.     Mouth/Throat:     Lips: Pink.     Mouth: Mucous membranes are moist. No injury.     Tongue: No lesions.  Pharynx: Oropharynx is clear. Uvula midline. No pharyngeal swelling, oropharyngeal exudate, posterior oropharyngeal erythema or uvula swelling.     Tonsils: No tonsillar exudate or tonsillar abscesses.  Eyes:     General: Lids are normal. Lids are everted, no foreign bodies appreciated. Vision grossly intact. Gaze aligned appropriately. No allergic shiner or visual field deficit.       Right eye: No discharge.        Left eye: No discharge.     Extraocular Movements: Extraocular movements intact.     Conjunctiva/sclera: Conjunctivae normal.     Right eye: Right conjunctiva is not injected. No exudate.    Left eye: Left conjunctiva is not injected. No exudate.    Pupils: Pupils are equal, round, and reactive to light.  Neck:     Thyroid: No thyroid mass, thyromegaly or thyroid tenderness.     Vascular: No carotid bruit.     Trachea: Trachea normal.  Cardiovascular:     Rate and Rhythm: Normal rate and regular rhythm.     Pulses: Normal pulses.          Carotid pulses are 2+ on the right side and 2+ on the left side.      Radial pulses are 2+ on the right side and 2+ on the left side.       Dorsalis pedis pulses are 2+ on the right side and 2+ on the left side.       Posterior tibial pulses are 2+ on the right side and 2+ on the left side.     Heart sounds: Normal heart sounds, S1 normal and S2 normal. No murmur heard.    No  friction rub. No gallop.  Pulmonary:     Effort: Pulmonary effort is normal. No respiratory distress.     Breath sounds: Normal breath sounds and air entry. No stridor. No wheezing, rhonchi or rales.  Chest:     Chest wall: No tenderness.  Abdominal:     General: Abdomen is flat. Bowel sounds are normal. There is no distension.     Palpations: Abdomen is soft. There is no mass.     Tenderness: There is no abdominal tenderness. There is no right CVA tenderness, left CVA tenderness, guarding or rebound.     Hernia: No hernia is present.  Genitourinary:    Comments: Exam deferred; denies complaints Known fibroids without concern in bleeding, fullness, change in bowe or bladder behavior PAP due Summer 25 Musculoskeletal:        General: No swelling, tenderness, deformity or signs of injury. Normal range of motion.     Cervical back: Full passive range of motion without pain, normal range of motion and neck supple. No edema, rigidity or tenderness. No muscular tenderness.     Right lower leg: No edema.     Left lower leg: No edema.  Lymphadenopathy:     Cervical: No cervical adenopathy.     Right cervical: No superficial, deep or posterior cervical adenopathy.    Left cervical: No superficial, deep or posterior cervical adenopathy.  Skin:    General: Skin is warm and dry.     Capillary Refill: Capillary refill takes less than 2 seconds.     Coloration: Skin is not jaundiced or pale.     Findings: No bruising, erythema, lesion or rash.  Neurological:     General: No focal deficit present.     Mental Status: She is alert and oriented to person, place, and time. Mental status  is at baseline.     GCS: GCS eye subscore is 4. GCS verbal subscore is 5. GCS motor subscore is 6.     Sensory: Sensation is intact. No sensory deficit.     Motor: Motor function is intact. No weakness.     Coordination: Coordination is intact. Coordination normal.     Gait: Gait is intact. Gait normal.   Psychiatric:        Attention and Perception: Attention and perception normal.        Mood and Affect: Mood and affect normal.        Speech: Speech normal.        Behavior: Behavior normal. Behavior is cooperative.        Thought Content: Thought content normal.        Cognition and Memory: Cognition and memory normal.        Judgment: Judgment normal.    Last depression screening scores    08/02/2023    9:01 AM 01/11/2023    8:52 AM 07/29/2022   10:44 AM  PHQ 2/9 Scores  PHQ - 2 Score 0 0 0  PHQ- 9 Score 0 0 0   Last fall risk screening    08/02/2023    9:01 AM  Fall Risk   Falls in the past year? 0  Number falls in past yr: 0  Injury with Fall? 0   Last Audit-C alcohol use screening    01/11/2023    8:52 AM  Alcohol Use Disorder Test (AUDIT)  1. How often do you have a drink containing alcohol? 0  2. How many drinks containing alcohol do you have on a typical day when you are drinking? 0  3. How often do you have six or more drinks on one occasion? 0  AUDIT-C Score 0   A score of 3 or more in women, and 4 or more in men indicates increased risk for alcohol abuse, EXCEPT if all of the points are from question 1   No results found for any visits on 08/02/23.  Assessment & Plan    Routine Health Maintenance and Physical Exam  Exercise Activities and Dietary recommendations  Goals   None     Immunization History  Administered Date(s) Administered   Influenza, Seasonal, Injecte, Preservative Fre 08/02/2023   Influenza,inj,Quad PF,6+ Mos 07/29/2022   Influenza-Unspecified 09/20/2017   Moderna Sars-Covid-2 Vaccination 11/12/2019, 12/10/2019, 09/12/2020   Td 07/07/1994, 06/27/2006, 11/22/2017   Tdap 06/27/2006   Zoster Recombinant(Shingrix) 07/29/2022, 10/06/2022    Health Maintenance  Topic Date Due   COVID-19 Vaccine (4 - 2023-24 season) 07/03/2023   MAMMOGRAM  12/17/2023   Cervical Cancer Screening (HPV/Pap Cotest)  04/04/2024   DTaP/Tdap/Td (5 - Td or  Tdap) 11/23/2027   Colonoscopy  09/10/2032   INFLUENZA VACCINE  Completed   Hepatitis C Screening  Completed   HIV Screening  Completed   Zoster Vaccines- Shingrix  Completed   HPV VACCINES  Aged Out    Discussed health benefits of physical activity, and encouraged her to engage in regular exercise appropriate for her age and condition.  Assessment and Plan    Knee Osteoarthritis Second knee surgery planned for February or March. No current pain or functional limitations reported. -Continue current management and plan for surgery.  Prediabetes Last A1C was 5.8. Patient reports adherence to a low carbohydrate diet and regular exercise at [employer]. -Check A1c today. -Continue lifestyle modifications.  Hyperlipidemia Cholesterol was elevated at last check. Patient reports dietary  changes including low carbohydrate diet and elimination of soda. -Check lipid panel today. -Continue lifestyle modifications.  Uterine Fibroids No current symptoms. Patient reports no pain or bleeding. Discussed potential symptoms to monitor for including bleeding, abdominal fullness, changes in bowel or bladder habits. -No intervention needed at this time. Monitor for symptoms.  Travel Medicine Patient planning travel to [place] in February. Discussed need for potential vaccines and prophylactic medications. -Advise patient to consult with local health department for travel medicine advice.  General Health Maintenance -Flu vaccine administered today. -Continue low dose aspirin and glucosamine chondroitin supplement. -Check complete blood count, blood chemistry, thyroid function, Vitamin D level today. -Next Pap smear due in June of next year.         Leilani Merl, FNP, have reviewed all documentation for this visit. The documentation on 08/02/23 for the exam, diagnosis, procedures, and orders are all accurate and complete.  Jacky Kindle, FNP  [facility] [phone] (phone) [phone] (fax)  [facility]

## 2023-08-03 LAB — HEMOGLOBIN A1C
Est. average glucose Bld gHb Est-mCnc: 117 mg/dL
Hgb A1c MFr Bld: 5.7 % — ABNORMAL HIGH (ref 4.8–5.6)

## 2023-08-03 LAB — TSH: TSH: 0.748 u[IU]/mL (ref 0.450–4.500)

## 2023-08-03 LAB — LIPID PANEL
Chol/HDL Ratio: 3.7 {ratio} (ref 0.0–4.4)
Cholesterol, Total: 239 mg/dL — ABNORMAL HIGH (ref 100–199)
HDL: 64 mg/dL (ref >39)
LDL Chol Calc (NIH): 150 mg/dL — ABNORMAL HIGH (ref 0–99)
Triglycerides: 142 mg/dL (ref 0–149)
VLDL Cholesterol Cal: 25 mg/dL (ref 5–40)

## 2023-08-03 LAB — COMPREHENSIVE METABOLIC PANEL
ALT: 37 [IU]/L — ABNORMAL HIGH (ref 0–32)
AST: 33 [IU]/L (ref 0–40)
Albumin: 4.5 g/dL (ref 3.8–4.9)
Alkaline Phosphatase: 81 [IU]/L (ref 44–121)
BUN/Creatinine Ratio: 18 (ref 9–23)
BUN: 14 mg/dL (ref 6–24)
Bilirubin Total: 0.3 mg/dL (ref 0.0–1.2)
CO2: 24 mmol/L (ref 20–29)
Calcium: 9.8 mg/dL (ref 8.7–10.2)
Chloride: 99 mmol/L (ref 96–106)
Creatinine, Ser: 0.76 mg/dL (ref 0.57–1.00)
Globulin, Total: 3.2 g/dL (ref 1.5–4.5)
Glucose: 84 mg/dL (ref 70–99)
Potassium: 4.3 mmol/L (ref 3.5–5.2)
Sodium: 140 mmol/L (ref 134–144)
Total Protein: 7.7 g/dL (ref 6.0–8.5)
eGFR: 91 mL/min/{1.73_m2} (ref >59)

## 2023-08-03 LAB — VITAMIN D 25 HYDROXY (VIT D DEFICIENCY, FRACTURES): Vit D, 25-Hydroxy: 64.9 ng/mL (ref 30.0–100.0)

## 2023-08-03 LAB — CBC WITH DIFFERENTIAL/PLATELET
Basophils Absolute: 0 10*3/uL (ref 0.0–0.2)
Basos: 1 %
EOS (ABSOLUTE): 0.1 10*3/uL (ref 0.0–0.4)
Eos: 1 %
Hematocrit: 42.6 % (ref 34.0–46.6)
Hemoglobin: 14 g/dL (ref 11.1–15.9)
Immature Grans (Abs): 0 10*3/uL (ref 0.0–0.1)
Immature Granulocytes: 0 %
Lymphocytes Absolute: 2.5 10*3/uL (ref 0.7–3.1)
Lymphs: 40 %
MCH: 32.3 pg (ref 26.6–33.0)
MCHC: 32.9 g/dL (ref 31.5–35.7)
MCV: 98 fL — ABNORMAL HIGH (ref 79–97)
Monocytes Absolute: 0.5 10*3/uL (ref 0.1–0.9)
Monocytes: 8 %
Neutrophils Absolute: 3.1 10*3/uL (ref 1.4–7.0)
Neutrophils: 50 %
Platelets: 238 10*3/uL (ref 150–450)
RBC: 4.33 x10E6/uL (ref 3.77–5.28)
RDW: 12.5 % (ref 11.7–15.4)
WBC: 6.3 10*3/uL (ref 3.4–10.8)

## 2023-12-19 ENCOUNTER — Encounter: Payer: Self-pay | Admitting: Family Medicine

## 2023-12-19 LAB — HM MAMMOGRAPHY

## 2024-01-15 NOTE — Discharge Instructions (Addendum)
 Instructions after Total Knee Replacement   Annette P. Angie Fava., M.D.    Dept. of Orthopaedics & Sports Medicine [facility] [address] [place], [place]  [address]  Phone: [phone]   Fax: [phone]       [email or web address]       DIET: Drink plenty of non-alcoholic fluids. Resume your normal diet. Include foods high in fiber.  ACTIVITY:  You may use crutches or a walker with weight-bearing as tolerated, unless instructed otherwise. You may be weaned off of the walker or crutches by your Physical Therapist.  Do NOT place pillows under the knee. Anything placed under the knee could limit your ability to straighten the knee.   Use the Bone Foam 3 times a day for 30 minutes each session to help straighten the knee. Continue doing gentle exercises. Exercising will reduce the pain and swelling, increase motion, and prevent muscle weakness.   Please continue to use the TED compression stockings for 6 weeks. You may remove the stockings at night, but should reapply them in the morning. Do not drive or operate any equipment until instructed.  WOUND CARE:  The initial dressing (Aquacel) can remain in place for 7 days (see separate instructions). Continue to use the PolarCare or ice packs periodically to reduce pain and swelling. You may bathe or shower after the staples are removed at the first office visit following surgery.  MEDICATIONS: You may resume your regular medications. Please take the pain medication as prescribed on the medication. Do not take pain medication on an empty stomach. Unless instructed otherwise, you should take an enteric-coated aspirin 81 mg. TWICE a day. (This along with elevation will help reduce the possibility of blood clots/phlebitis in your operated leg.) Use a stool softener (such as Senokot-S or Colace) daily and a laxative (such as Miralax or Dulcolax) as needed to prevent constipation.  Do not drive or drink alcoholic beverages when  taking pain medications.  CALL THE OFFICE FOR: Temperature above 101 degrees Excessive bleeding or drainage on the dressing. Excessive swelling, coldness, or paleness of the toes. Persistent nausea and vomiting.  FOLLOW-UP:  You should have an appointment to return to the office in 10-14 days after surgery. Arrangements have been made for continuation of Physical Therapy (either home therapy or outpatient therapy).     [facility] Department Directory         [email or web address]       [email or web address]          Cardiology  Appointments: [place] - [phone]  Endocrinology  Appointments: [place] [phone] [place] - [phone]  Gastroenterology  Appointments: [place] [phone] [place] - [phone]        General Surgery   Appointments: [facility]  Internal Medicine/Family Medicine  Appointments: [facility] [place] - [phone] [place] - [phone]  Metabolic and Weigh Loss Surgery  Appointments: [facility]        Neurology  Appointments: [place] [phone] [place] - [phone]  Neurosurgery  Appointments: [place]  Obstetrics & Gynecology  Appointments: [place] [phone] [place] - [phone]        Pediatrics  Appointments: [place] [phone] [place] - [phone]  Physiatry  Appointments: [place] [phone]  Physical Therapy  Appointments: [place] - [phone]        Podiatry  Appointments: [place] [phone] [place] - [phone]  Pulmonology  Appointments: [place]  Rheumatology  Appointments: [place] [phone]  [place] Location: [facility]  [address] [place], [place]  [address]  [place] Location: [facility]. [address] [place], [place]  [address]  [place]  Location: [facility] [address] [place], [place]  [address]     [employer]  [address] [place], [place], [address]. [phone] They will call you to arrange when they can come to see you

## 2024-01-19 ENCOUNTER — Other Ambulatory Visit: Payer: Self-pay

## 2024-01-19 ENCOUNTER — Encounter
Admission: RE | Admit: 2024-01-19 | Discharge: 2024-01-19 | Disposition: A | Discharge status: Home / Self Care | Source: Ambulatory Visit | Attending: Orthopedic Surgery | Admitting: Orthopedic Surgery

## 2024-01-19 VITALS — BP 124/82 | HR 97 | Resp 16 | Ht 62.0 in | Wt 218.9 lb

## 2024-01-19 DIAGNOSIS — Z01812 Encounter for preprocedural laboratory examination: Secondary | ICD-10-CM

## 2024-01-19 DIAGNOSIS — M1711 Unilateral primary osteoarthritis, right knee: Secondary | ICD-10-CM | POA: Diagnosis not present

## 2024-01-19 DIAGNOSIS — Z01810 Encounter for preprocedural cardiovascular examination: Secondary | ICD-10-CM | POA: Diagnosis not present

## 2024-01-19 DIAGNOSIS — Z01818 Encounter for other preprocedural examination: Secondary | ICD-10-CM | POA: Insufficient documentation

## 2024-01-19 HISTORY — DX: Anemia, unspecified: D64.9

## 2024-01-19 LAB — CBC
HCT: 39.9 % (ref 36.0–46.0)
Hemoglobin: 13.6 g/dL (ref 12.0–15.0)
MCH: 33.1 pg (ref 26.0–34.0)
MCHC: 34.1 g/dL (ref 30.0–36.0)
MCV: 97.1 fL (ref 80.0–100.0)
Platelets: 242 K/uL (ref 150–400)
RBC: 4.11 MIL/uL (ref 3.87–5.11)
RDW: 13.5 % (ref 11.5–15.5)
WBC: 6.1 K/uL (ref 4.0–10.5)
nRBC: 0 % (ref 0.0–0.2)

## 2024-01-19 LAB — C-REACTIVE PROTEIN: CRP: 1.2 mg/dL — ABNORMAL HIGH (ref <1.0)

## 2024-01-19 LAB — URINALYSIS, ROUTINE W REFLEX MICROSCOPIC
Bilirubin Urine: NEGATIVE
Glucose, UA: NEGATIVE mg/dL
Hgb urine dipstick: NEGATIVE
Ketones, ur: NEGATIVE mg/dL
Leukocytes,Ua: NEGATIVE
Nitrite: NEGATIVE
Protein, ur: NEGATIVE mg/dL
Specific Gravity, Urine: 1.023 (ref 1.005–1.030)
pH: 7 (ref 5.0–8.0)

## 2024-01-19 LAB — COMPREHENSIVE METABOLIC PANEL WITH GFR
ALT: 37 U/L (ref 0–44)
AST: 34 U/L (ref 15–41)
Albumin: 4 g/dL (ref 3.5–5.0)
Alkaline Phosphatase: 62 U/L (ref 38–126)
Anion gap: 5 (ref 5–15)
BUN: 25 mg/dL — ABNORMAL HIGH (ref 6–20)
CO2: 27 mmol/L (ref 22–32)
Calcium: 9.1 mg/dL (ref 8.9–10.3)
Chloride: 106 mmol/L (ref 98–111)
Creatinine, Ser: 0.71 mg/dL (ref 0.44–1.00)
GFR, Estimated: >60 mL/min (ref >60)
Glucose, Bld: 81 mg/dL (ref 70–99)
Potassium: 3.7 mmol/L (ref 3.5–5.1)
Sodium: 138 mmol/L (ref 135–145)
Total Bilirubin: 0.6 mg/dL (ref 0.0–1.2)
Total Protein: 7.5 g/dL (ref 6.5–8.1)

## 2024-01-19 LAB — SURGICAL PCR SCREEN
MRSA, PCR: NEGATIVE
Staphylococcus aureus: POSITIVE — AB

## 2024-01-19 LAB — SEDIMENTATION RATE: Sed Rate: 27 mm/h (ref 0–30)

## 2024-01-19 NOTE — Patient Instructions (Addendum)
 Your procedure is scheduled on: 01/30/24 - Monday Report to the Registration Desk on the 1st floor of the [facility]. To find out your arrival time, please call [phone] between 1PM - 3PM on: 01/27/24 - Friday If your arrival time is 6:00 am, do not arrive before that time as the [facility] entrance doors do not open until 6:00 am.  REMEMBER: Instructions that are not followed completely may result in serious medical risk, up to and including death; or upon the discretion of your surgeon and anesthesiologist your surgery may need to be rescheduled.  Do not eat food after midnight the night before surgery.  No gum chewing or hard candies.  You may however, drink CLEAR liquids up to 2 hours before you are scheduled to arrive for your surgery. Do not drink anything within 2 hours of your scheduled arrival time.  Clear liquids include: - water  - apple juice without pulp - gatorade (not RED colors) - black coffee or tea (Do NOT add milk or creamers to the coffee or tea) Do NOT drink anything that is not on this list.  In addition, your doctor has ordered for you to drink the provided:  Ensure Pre-Surgery Clear Carbohydrate Drink  Drinking this carbohydrate drink up to two hours before surgery helps to reduce insulin resistance and improve patient outcomes. Please complete drinking 2 hours before scheduled arrival time.  One week prior to surgery: Stop Anti-inflammatories (NSAIDS) such as Advil, Aleve, Ibuprofen, Motrin, Naproxen, Naprosyn and Aspirin based products such as Excedrin, Goody's Powder, BC Powder. You may take Tylenol if needed for pain up until the day of surgery.  Stop taking beginning 03/24, ANY OVER THE COUNTER supplements until after surgery : OSTEO BI-FLEX ,Multiple Vitamin (MULTIVITAMIN) .   ON THE DAY OF SURGERY ONLY TAKE THESE MEDICATIONS WITH SIPS OF WATER:  None   No Alcohol for 24 hours before or after surgery.  No Smoking including e-cigarettes for  24 hours before surgery.  No chewable tobacco products for at least 6 hours before surgery.  No nicotine patches on the day of surgery.  Do not use any "recreational" drugs for at least a week (preferably 2 weeks) before your surgery.  Please be advised that the combination of cocaine and anesthesia may have negative outcomes, up to and including death. If you test positive for cocaine, your surgery will be cancelled.  On the morning of surgery brush your teeth with toothpaste and water, you may rinse your mouth with mouthwash if you wish. Do not swallow any toothpaste or mouthwash.  Use CHG Soap or wipes as directed on instruction sheet.  Do not wear jewelry, make-up, hairpins, clips or nail polish.  For welded (permanent) jewelry: bracelets, anklets, waist bands, etc.  Please have this removed prior to surgery.  If it is not removed, there is a chance that hospital personnel will need to cut it off on the day of surgery.  Do not wear lotions, powders, or perfumes.   Do not shave body hair from the neck down 48 hours before surgery.  Contact lenses, hearing aids and dentures may not be worn into surgery.  Do not bring valuables to the hospital. [facility] is not responsible for any missing/lost belongings or valuables.   Notify your doctor if there is any change in your medical condition (cold, fever, infection).  Wear comfortable clothing (specific to your surgery type) to the hospital.  After surgery, you can help prevent lung complications by doing  breathing exercises.  Take deep breaths and cough every 1-2 hours. Your doctor may order a device called an Incentive Spirometer to help you take deep breaths.  When coughing or sneezing, hold a pillow firmly against your incision with both hands. This is called "splinting." Doing this helps protect your incision. It also decreases belly discomfort.  If you are being admitted to the hospital overnight, leave your suitcase in the car.  After surgery it may be brought to your room.  In case of increased patient census, it may be necessary for you, the patient, to continue your postoperative care in the Same Day Surgery department.  If you are being discharged the day of surgery, you will not be allowed to drive home. You will need a responsible individual to drive you home and stay with you for 24 hours after surgery.   If you are taking public transportation, you will need to have a responsible individual with you.  Please call the Pre-admissions Testing Dept. at [phone] if you have any questions about these instructions.  Surgery Visitation Policy:  Patients having surgery or a procedure may have two visitors.  Children under the age of 87 must have an adult with them who is not the patient.  Temporary Visitor Restrictions Due to increasing cases of flu, RSV and COVID-19: Children ages 55 and under will not be able to visit patients in [facility] hospitals under most circumstances.  Inpatient Visitation:    Visiting hours are 7 a.m. to 8 p.m. Up to four visitors are allowed at one time in a patient room. The visitors may rotate out with other people during the day.  One visitor age 58 or older may stay with the patient overnight and must be in the room by 8 p.m.    Pre-operative 5 CHG Bath Instructions   You can play a key role in reducing the risk of infection after surgery. Your skin needs to be as free of germs as possible. You can reduce the number of germs on your skin by washing with CHG (chlorhexidine gluconate) soap before surgery. CHG is an antiseptic soap that kills germs and continues to kill germs even after washing.   DO NOT use if you have an allergy to chlorhexidine/CHG or antibacterial soaps. If your skin becomes reddened or irritated, stop using the CHG and notify one of our RNs at [phone].   Please shower with the CHG soap starting 4 days before surgery using the following schedule:   03/27 - 03/31.    Please keep in mind the following:  DO NOT shave, including legs and underarms, starting the day of your first shower.   You may shave your face at any point before/day of surgery.  Place clean sheets on your bed the day you start using CHG soap. Use a clean washcloth (not used since being washed) for each shower. DO NOT sleep with pets once you start using the CHG.   CHG Shower Instructions:  If you choose to wash your hair and private area, wash first with your normal shampoo/soap.  After you use shampoo/soap, rinse your hair and body thoroughly to remove shampoo/soap residue.  Turn the water OFF and apply about 3 tablespoons (45 ml) of CHG soap to a CLEAN washcloth.  Apply CHG soap ONLY FROM YOUR NECK DOWN TO YOUR TOES (washing for 3-5 minutes)  DO NOT use CHG soap on face, private areas, open wounds, or sores.  Pay special attention to the area  where your surgery is being performed.  If you are having back surgery, having someone wash your back for you may be helpful. Wait 2 minutes after CHG soap is applied, then you may rinse off the CHG soap.  Pat dry with a clean towel  Put on clean clothes/pajamas   If you choose to wear lotion, please use ONLY the CHG-compatible lotions on the back of this paper.     Additional instructions for the day of surgery: DO NOT APPLY any lotions, deodorants, cologne, or perfumes.   Put on clean/comfortable clothes.  Brush your teeth.  Ask your nurse before applying any prescription medications to the skin.      CHG Compatible Lotions   Aveeno Moisturizing lotion  Cetaphil Moisturizing Cream  Cetaphil Moisturizing Lotion  Clairol Herbal Essence Moisturizing Lotion, Dry Skin  Clairol Herbal Essence Moisturizing Lotion, Extra Dry Skin  Clairol Herbal Essence Moisturizing Lotion, Normal Skin  Curel Age Defying Therapeutic Moisturizing Lotion with Alpha Hydroxy  Curel Extreme Care Body Lotion  Curel Soothing Hands  Moisturizing Hand Lotion  Curel Therapeutic Moisturizing Cream, Fragrance-Free  Curel Therapeutic Moisturizing Lotion, Fragrance-Free  Curel Therapeutic Moisturizing Lotion, Original Formula  Eucerin Daily Replenishing Lotion  Eucerin Dry Skin Therapy Plus Alpha Hydroxy Crme  Eucerin Dry Skin Therapy Plus Alpha Hydroxy Lotion  Eucerin Original Crme  Eucerin Original Lotion  Eucerin Plus Crme Eucerin Plus Lotion  Eucerin TriLipid Replenishing Lotion  Keri Anti-Bacterial Hand Lotion  Keri Deep Conditioning Original Lotion Dry Skin Formula Softly Scented  Keri Deep Conditioning Original Lotion, Fragrance Free Sensitive Skin Formula  Keri Lotion Fast Absorbing Fragrance Free Sensitive Skin Formula  Keri Lotion Fast Absorbing Softly Scented Dry Skin Formula  Keri Original Lotion  Keri Skin Renewal Lotion Keri Silky Smooth Lotion  Keri Silky Smooth Sensitive Skin Lotion  Nivea Body Creamy Conditioning Oil  Nivea Body Extra Enriched Lotion  Nivea Body Original Lotion  Nivea Body Sheer Moisturizing Lotion Nivea Crme  Nivea Skin Firming Lotion  NutraDerm 30 Skin Lotion  NutraDerm Skin Lotion  NutraDerm Therapeutic Skin Cream  NutraDerm Therapeutic Skin Lotion  ProShield Protective Hand Cream  Provon moisturizing lotion  How to Use an Incentive Spirometer  An incentive spirometer is a tool that measures how well you are filling your lungs with each breath. Learning to take long, deep breaths using this tool can help you keep your lungs clear and active. This may help to reverse or lessen your chance of developing breathing (pulmonary) problems, especially infection. You may be asked to use a spirometer: After a surgery. If you have a lung problem or a history of smoking. After a long period of time when you have been unable to move or be active. If the spirometer includes an indicator to show the highest number that you have reached, your health care provider or respiratory therapist  will help you set a goal. Keep a log of your progress as told by your health care provider. What are the risks? Breathing too quickly may cause dizziness or cause you to pass out. Take your time so you do not get dizzy or light-headed. If you are in pain, you may need to take pain medicine before doing incentive spirometry. It is harder to take a deep breath if you are having pain. How to use your incentive spirometer  Sit up on the edge of your bed or on a chair. Hold the incentive spirometer so that it is in an upright position. Before you use  the spirometer, breathe out normally. Place the mouthpiece in your mouth. Make sure your lips are closed tightly around it. Breathe in slowly and as deeply as you can through your mouth, causing the piston or the ball to rise toward the top of the chamber. Hold your breath for 3-5 seconds, or for as long as possible. If the spirometer includes a coach indicator, use this to guide you in breathing. Slow down your breathing if the indicator goes above the marked areas. Remove the mouthpiece from your mouth and breathe out normally. The piston or ball will return to the bottom of the chamber. Rest for a few seconds, then repeat the steps 10 or more times. Take your time and take a few normal breaths between deep breaths so that you do not get dizzy or light-headed. Do this every 1-2 hours when you are awake. If the spirometer includes a goal marker to show the highest number you have reached (best effort), use this as a goal to work toward during each repetition. After each set of 10 deep breaths, cough a few times. This will help to make sure that your lungs are clear. If you have an incision on your chest or abdomen from surgery, place a pillow or a rolled-up towel firmly against the incision when you cough. This can help to reduce pain while taking deep breaths and coughing. General tips When you are able to get out of bed: Walk around often. Continue  to take deep breaths and cough in order to clear your lungs. Keep using the incentive spirometer until your health care provider says it is okay to stop using it. If you have been in the hospital, you may be told to keep using the spirometer at home. Contact a health care provider if: You are having difficulty using the spirometer. You have trouble using the spirometer as often as instructed. Your pain medicine is not giving enough relief for you to use the spirometer as told. You have a fever. Get help right away if: You develop shortness of breath. You develop a cough with bloody mucus from the lungs. You have fluid or blood coming from an incision site after you cough. Summary An incentive spirometer is a tool that can help you learn to take long, deep breaths to keep your lungs clear and active. You may be asked to use a spirometer after a surgery, if you have a lung problem or a history of smoking, or if you have been inactive for a long period of time. Use your incentive spirometer as instructed every 1-2 hours while you are awake. If you have an incision on your chest or abdomen, place a pillow or a rolled-up towel firmly against your incision when you cough. This will help to reduce pain. Get help right away if you have shortness of breath, you cough up bloody mucus, or blood comes from your incision when you cough. This information is not intended to replace advice given to you by your health care provider. Make sure you discuss any questions you have with your health care provider. Document Revised: 01/07/2020 Document Reviewed: 01/07/2020 Elsevier Patient Education  2023 Elsevier Inc.    POLAR CARE INFORMATION  MassAdvertisement.it  How to use [facility] Therapy System?  YouTube   ShippingScam.co.uk  OPERATING INSTRUCTIONS  Start the product With dry hands, connect the transformer to the electrical connection located on the top of the  cooler. Next, plug the transformer into an appropriate  electrical outlet. The unit will automatically start running at this point.  To stop the pump, disconnect electrical power.  Unplug to stop the product when not in use. Unplugging the Polar Care unit turns it off. Always unplug immediately after use. Never leave it plugged in while unattended. Remove pad.    FIRST ADD WATER TO FILL LINE, THEN ICE---Replace ice when existing ice is almost melted  1 Discuss Treatment with your Licensed Health Care Practitioner and Use Only as Prescribed 2 Apply Insulation Barrier & Cold Therapy Pad 3 Check for Moisture 4 Inspect Skin Regularly  Tips and Trouble Shooting Usage Tips 1. Use cubed or chunked ice for optimal performance. 2. It is recommended to drain the Pad between uses. To drain the pad, hold the Pad upright with the hose pointed toward the ground. Depress the black plunger and allow water to drain out. 3. You may disconnect the Pad from the unit without removing the pad from the affected area by depressing the silver tabs on the hose coupling and gently pulling the hoses apart. The Pad and unit will seal itself and will not leak. Note: Some dripping during release is normal. 4. DO NOT RUN PUMP WITHOUT WATER! The pump in this unit is designed to run with water. Running the unit without water will cause permanent damage to the pump. 5. Unplug unit before removing lid.  TROUBLESHOOTING GUIDE Pump not running, Water not flowing to the pad, Pad is not getting cold 1. Make sure the transformer is plugged into the wall outlet. 2. Confirm that the ice and water are filled to the indicated levels. 3. Make sure there are no kinks in the pad. 4. Gently pull on the blue tube to make sure the tube/pad junction is straight. 5. Remove the pad from the treatment site and ll it while the pad is lying at; then reapply. 6. Confirm that the pad couplings are securely attached to the unit. Listen for the double  clicks (Figure 1) to confirm the pad couplings are securely attached.  Leaks    Note: Some condensation on the lines, controller, and pads is unavoidable, especially in warmer climates. 1. If using a [employer] Polar Care Cold Therapy unit with a detachable Cold Therapy Pad, and a leak exists (other than condensation on the lines) disconnect the pad couplings. Make sure the silver tabs on the couplings are depressed before reconnecting the pad to the pump hose; then confirm both sides of the coupling are properly clicked in. 2. If the coupling continues to leak or a leak is detected in the pad itself, stop using it and call [employer] Customer Care at [phone].  Cleaning After use, empty and dry the unit with a soft cloth. Warm water and mild detergent may be used occasionally to clean the pump and tubes.  WARNING: The Polar Care Cube can be cold enough to cause serious injury, including full skin necrosis. Follow these Operating Instructions, and carefully read the Product Insert (see pouch on side of unit) and the Cold Therapy Pad Fitting Instructions (provided with each Cold Therapy Pad) prior to use.  Preoperative Educational Videos for Total Hip, Knee and Shoulder Replacements  To better prepare for surgery, please view our videos that explain the physical activity and discharge planning required to have the best surgical recovery at [facility].  [email or web address]  Questions? Call [phone] or email [email or web address]

## 2024-01-27 MED ORDER — PROPOFOL 10 MG/ML IV BOLUS
INTRAVENOUS | Status: AC
  Filled 2024-01-27: qty 40

## 2024-01-27 MED ORDER — PROPOFOL 1000 MG/100ML IV EMUL
INTRAVENOUS | Status: AC
  Filled 2024-01-27: qty 100

## 2024-01-29 ENCOUNTER — Encounter: Payer: Self-pay | Admitting: Orthopedic Surgery

## 2024-01-29 MED ORDER — DEXAMETHASONE SODIUM PHOSPHATE 10 MG/ML IJ SOLN
8.0000 mg | Freq: Once | INTRAMUSCULAR | Status: AC
  Administered 2024-01-30: 8 mg via INTRAVENOUS

## 2024-01-29 MED ORDER — CELECOXIB 200 MG PO CAPS
400.0000 mg | ORAL_CAPSULE | Freq: Once | ORAL | Status: AC
  Administered 2024-01-30: 400 mg via ORAL

## 2024-01-29 MED ORDER — CHLORHEXIDINE GLUCONATE 4 % EX SOLN: 60.0000 mL | Freq: Once | CUTANEOUS | Status: DC

## 2024-01-29 MED ORDER — GABAPENTIN 300 MG PO CAPS
300.0000 mg | ORAL_CAPSULE | Freq: Once | ORAL | Status: AC
  Administered 2024-01-30: 300 mg via ORAL

## 2024-01-29 MED ORDER — CEFAZOLIN SODIUM-DEXTROSE 2-4 GM/100ML-% IV SOLN
2.0000 g | INTRAVENOUS | Status: AC
  Administered 2024-01-30: 2 g via INTRAVENOUS

## 2024-01-29 MED ORDER — CHLORHEXIDINE GLUCONATE 0.12 % MT SOLN
15.0000 mL | Freq: Once | OROMUCOSAL | Status: AC
  Administered 2024-01-30: 15 mL via OROMUCOSAL

## 2024-01-29 MED ORDER — LACTATED RINGERS IV SOLN: INTRAVENOUS | Status: DC

## 2024-01-29 MED ORDER — ORAL CARE MOUTH RINSE: 15.0000 mL | Freq: Once | OROMUCOSAL | Status: AC

## 2024-01-29 MED ORDER — TRANEXAMIC ACID-NACL 1000-0.7 MG/100ML-% IV SOLN
1000.0000 mg | INTRAVENOUS | Status: AC
  Administered 2024-01-30: 1000 mg via INTRAVENOUS

## 2024-01-29 NOTE — H&P (Signed)
 ORTHOPAEDIC HISTORY & PHYSICAL Raenette Rover, [place] - 01/23/2024 10:30 AM EDT Formatting of this note is different from the original. NAME: Annette Bradford H&P Date: 01/23/2024 Procedure Date: 01/30/2024  Chief Complaint: right knee pain  HPI Annette Bradford is a 58 y.o. female who has severe Right knee pain. Patient has a several year history of right knee discomfort that has persisted. She states that over the last 5 months. She states that most of the pain localizes along the medial aspect of her knee. She does report intermittent swelling and giving way of the knee. Denies any locking symptoms. She states that the pain is made worse with any prolonged standing, ambulation as well as going up and down stairs. It has greatly affected her ability to ambulate long distances and perform her ADLs that she would like. She has failed conservative treatment including Tylenol, OTC NSAIDs and medications, glucosamine/chondroitin, exercise, activity modification and topical ice. She is not utilizing any ambulatory aids at this time . She has requested operative intervention for relief of her DJD symptoms. Patient denies having any known cardiac or pulmonary issue. No history of any DVTs or clots. Patient is a prediabetic, last A1c was at 5.7. Patient denies any previous surgeries on her right knee. Of note, the patient does have a history of a left total knee arthroplasty performed by Dr. Ernest Pine in April 2023.  Social Hx: Patient lives at home with her husband. Has other family around the area who are planning to help look after her postoperatively. Patient works as a Architect at [employer]. Denies any alcohol, illicit drug or nicotine use. Denies any smoking or vaping.  Medications & Allergies Allergies: No Known Allergies  Home Medicines: Current Outpatient Medications on File Prior to Visit Medication Sig Dispense Refill amoxicillin (AMOXIL) 500 MG capsule Take 2,000 mg by mouth once one  hour before dental appt. aspirin 81 MG chewable tablet Take 81 mg by mouth once daily beta-carotene,A,-vits C,E/mins (VISION FORMULA ORAL) Take 1 tablet by mouth once daily docusate sodium (STOOL SOFTENER ORAL) Take 1 capsule by mouth once daily glucosam/chond/hyalu/CF borate (MOVE FREE JOINT HEALTH ORAL) Take 1 tablet by mouth once daily glucosamine/chondr su A sod (OSTEO BI-FLEX ORAL) Take 1 tablet by mouth 2 (two) times daily multivitamin/iron/folic acid (CENTRUM COMPLETE ORAL) Take 1 tablet by mouth once daily  No current facility-administered medications on file prior to visit.  Medical / Surgical History  Past Medical History: Diagnosis Date Avitaminosis A Borderline diabetes Carpal tunnel syndrome, bilateral Chronic joint pain Hypercholesterolemia with hypertriglyceridemia   Past Surgical History: Procedure Laterality Date Colonoscopy with polypectomy 2017 Left total knee arthroplasty using computer-assisted navigation 02/10/2022 Dr Ernest Pine   Physical Exam  Ht:157.5 cm (5\' 2" ) Wt:97.5 kg (215 lb) BMI: Body mass index is 39.32 kg/m.  General/Constitutional: No apparent distress: well-nourished and well developed. Eyes: Pupils equal, round with synchronous movement. Lymphatic: No palpable adenopathy. Respiratory: Patient has good chest rise and fall with inspiration and expiration. All lung fields are clear to auscultation bilaterally. There is no Rales, rhonchi or wheezes appreciated. Cardiovascular: Upon auscultation there is a regular rate and rhythm without any murmurs, rubs, gallops or heaves appreciated. There does not appear to be any swelling down the lower extremities. Posterior tibial pulses appreciated bilaterally, 2+. Integumentary: No impressive skin lesions present, except as noted in detailed exam. Neuro/Psych: Normal mood and affect, oriented to person, place and time. Musculoskeletal: see exam below  Right knee exam Upon inspection of the patient's  right knee there does not appear to be any skin changes, open abrasions, swelling or redness. There is a varus alignment. Upon palpation, the patient reports having pain along the medial aspect of their knee. Patient has full extension actively with ROM, and able to flex back to 101 degrees with mild pain. Varus and valgus stress testing shows positive psuedolaxity to valgus stressing. The patella tracks well within the femoral groove from flexion into extension with mild crepitus appreciated. Anterior and posterior drawer testing negative. Patient is neurovascularly intact down their lower extremity to all dermatomes. Posterior tibial pulses appreciated 2+.  Imaging right Knee Imaging: A series of x-rays were ordered and interpreted of the patient's right knee. These images included AP weightbearing, lateral and sunrise views. Upon inspection, there is noticeable loss of medial cartilage space with complete bone-on-bone articulation. There are subchondral changes and osteophyte formations present. Overall alignment is varus. No fractures, lytic lesions or gross deformities appreciated on films.  Assesment and Plan Knee DJD  I have recommended that Annette Bradford undergo right total knee replacement. Consents has been signed. The risks, benefits, prognosis and alternatives including but not limited to DVT, PE, infection, neurovascular injury, failure of the procedure and death were explained to the patient and she is willing to proceed with surgery as described to her by myself. Plan will be for post operative admission of at least 1 midnight for pain control and PT. She will be managed with DVT prophylaxis, antibiotics preoperatively for 24 hours and aggressive in patient rehab.  Pre, intra and post op interventions were discussed. Patient has good understanding  Medication Reconciliation was performed. Discussed cessation of vitamins and supplements.  A total of 45 minutes was spent reviewing  patient's charts, medical reconciliation, discussing/educating the patient about surgical interventions, and answering any questions provided by the patient.  JOSHUA Kendrick Fries, PA [facility] 01/23/2024  Electronically signed by Raenette Rover, PA at 01/23/2024 6:10 PM EDT

## 2024-01-30 ENCOUNTER — Other Ambulatory Visit: Payer: Self-pay

## 2024-01-30 ENCOUNTER — Observation Stay

## 2024-01-30 ENCOUNTER — Encounter
Admission: RE | Disposition: A | Discharge status: Home / Self Care | Payer: Self-pay | Source: Ambulatory Visit | Attending: Orthopedic Surgery

## 2024-01-30 ENCOUNTER — Ambulatory Visit: Payer: Self-pay | Admitting: Urgent Care

## 2024-01-30 ENCOUNTER — Ambulatory Visit

## 2024-01-30 ENCOUNTER — Encounter: Payer: Self-pay | Admitting: Orthopedic Surgery

## 2024-01-30 ENCOUNTER — Observation Stay
Admission: RE | Admit: 2024-01-30 | Discharge: 2024-01-31 | Disposition: A | Discharge status: Home / Self Care | Source: Ambulatory Visit | Attending: Orthopedic Surgery | Admitting: Orthopedic Surgery

## 2024-01-30 DIAGNOSIS — R7303 Prediabetes: Secondary | ICD-10-CM

## 2024-01-30 DIAGNOSIS — Z7982 Long term (current) use of aspirin: Secondary | ICD-10-CM | POA: Diagnosis not present

## 2024-01-30 DIAGNOSIS — M1711 Unilateral primary osteoarthritis, right knee: Secondary | ICD-10-CM | POA: Diagnosis present

## 2024-01-30 DIAGNOSIS — Z79899 Other long term (current) drug therapy: Secondary | ICD-10-CM | POA: Insufficient documentation

## 2024-01-30 DIAGNOSIS — Z96652 Presence of left artificial knee joint: Secondary | ICD-10-CM | POA: Insufficient documentation

## 2024-01-30 DIAGNOSIS — Z96651 Presence of right artificial knee joint: Secondary | ICD-10-CM

## 2024-01-30 HISTORY — PX: KNEE ARTHROPLASTY: SHX992

## 2024-01-30 LAB — GLUCOSE, CAPILLARY
Glucose-Capillary: 107 mg/dL — ABNORMAL HIGH (ref 70–99)
Glucose-Capillary: 165 mg/dL — ABNORMAL HIGH (ref 70–99)
Glucose-Capillary: 194 mg/dL — ABNORMAL HIGH (ref 70–99)

## 2024-01-30 SURGERY — ARTHROPLASTY, KNEE, TOTAL, USING IMAGELESS COMPUTER-ASSISTED NAVIGATION
Anesthesia: Spinal | Site: Knee | Laterality: Right

## 2024-01-30 MED ORDER — ACETAMINOPHEN 10 MG/ML IV SOLN: 1000.0000 mg | Freq: Once | INTRAVENOUS | Status: DC | PRN

## 2024-01-30 MED ORDER — SODIUM CHLORIDE 0.9 % IV SOLN: INTRAVENOUS | Status: DC

## 2024-01-30 MED ORDER — ONDANSETRON HCL 4 MG/2ML IJ SOLN
INTRAMUSCULAR | Status: DC | PRN
  Administered 2024-01-30: 4 mg via INTRAVENOUS

## 2024-01-30 MED ORDER — MENTHOL 3 MG MT LOZG: 1.0000 | LOZENGE | OROMUCOSAL | Status: DC | PRN

## 2024-01-30 MED ORDER — TRANEXAMIC ACID-NACL 1000-0.7 MG/100ML-% IV SOLN
INTRAVENOUS | Status: AC
  Filled 2024-01-30: qty 100

## 2024-01-30 MED ORDER — OXYCODONE HCL 5 MG PO TABS: 5.0000 mg | ORAL_TABLET | Freq: Once | ORAL | Status: DC | PRN

## 2024-01-30 MED ORDER — INSULIN ASPART 100 UNIT/ML IJ SOLN
0.0000 [IU] | Freq: Every day | INTRAMUSCULAR | Status: DC
Start: 2024-01-30 — End: 2024-01-31

## 2024-01-30 MED ORDER — OXYCODONE HCL 5 MG/5ML PO SOLN: 5.0000 mg | Freq: Once | ORAL | Status: DC | PRN

## 2024-01-30 MED ORDER — PHENYLEPHRINE HCL-NACL 20-0.9 MG/250ML-% IV SOLN
INTRAVENOUS | Status: DC | PRN
  Administered 2024-01-30: 30 ug/min via INTRAVENOUS

## 2024-01-30 MED ORDER — GABAPENTIN 300 MG PO CAPS
ORAL_CAPSULE | ORAL | Status: AC
  Filled 2024-01-30: qty 1

## 2024-01-30 MED ORDER — PANTOPRAZOLE SODIUM 40 MG PO TBEC
40.0000 mg | DELAYED_RELEASE_TABLET | Freq: Two times a day (BID) | ORAL | Status: DC
  Administered 2024-01-30 – 2024-01-31 (×2): 40 mg via ORAL
  Filled 2024-01-30 (×2): qty 1

## 2024-01-30 MED ORDER — CELECOXIB 200 MG PO CAPS
200.0000 mg | ORAL_CAPSULE | Freq: Two times a day (BID) | ORAL | Status: DC
  Administered 2024-01-30 – 2024-01-31 (×2): 200 mg via ORAL
  Filled 2024-01-30 (×2): qty 1

## 2024-01-30 MED ORDER — MIDAZOLAM HCL 5 MG/5ML IJ SOLN
INTRAMUSCULAR | Status: DC | PRN
  Administered 2024-01-30: 2 mg via INTRAVENOUS

## 2024-01-30 MED ORDER — OXYCODONE HCL 5 MG PO TABS
5.0000 mg | ORAL_TABLET | ORAL | Status: DC | PRN
  Administered 2024-01-30: 5 mg via ORAL
  Filled 2024-01-30: qty 1

## 2024-01-30 MED ORDER — PROPOFOL 500 MG/50ML IV EMUL
INTRAVENOUS | Status: DC | PRN
  Administered 2024-01-30: 100 ug/kg/min via INTRAVENOUS

## 2024-01-30 MED ORDER — FLEET ENEMA RE ENEM: 1.0000 | ENEMA | Freq: Once | RECTAL | Status: DC | PRN

## 2024-01-30 MED ORDER — MAGNESIUM HYDROXIDE 400 MG/5ML PO SUSP
30.0000 mL | Freq: Every day | ORAL | Status: DC
Start: 2024-01-30 — End: 2024-01-31
  Administered 2024-01-31: 30 mL via ORAL
  Filled 2024-01-30: qty 30

## 2024-01-30 MED ORDER — DROPERIDOL 2.5 MG/ML IJ SOLN: 0.6250 mg | Freq: Once | INTRAMUSCULAR | Status: DC | PRN

## 2024-01-30 MED ORDER — ACETAMINOPHEN 10 MG/ML IV SOLN
INTRAVENOUS | Status: DC | PRN
  Administered 2024-01-30: 1000 mg via INTRAVENOUS

## 2024-01-30 MED ORDER — SODIUM CHLORIDE 0.9 % IV SOLN
INTRAVENOUS | Status: DC | PRN
  Administered 2024-01-30: 60 mL

## 2024-01-30 MED ORDER — FENTANYL CITRATE (PF) 100 MCG/2ML IJ SOLN: 25.0000 ug | INTRAMUSCULAR | Status: DC | PRN

## 2024-01-30 MED ORDER — DIPHENHYDRAMINE HCL 12.5 MG/5ML PO ELIX: 12.5000 mg | ORAL_SOLUTION | ORAL | Status: DC | PRN

## 2024-01-30 MED ORDER — HYDROMORPHONE HCL 1 MG/ML IJ SOLN: 0.5000 mg | INTRAMUSCULAR | Status: DC | PRN

## 2024-01-30 MED ORDER — ONDANSETRON HCL 4 MG PO TABS: 4.0000 mg | ORAL_TABLET | Freq: Four times a day (QID) | ORAL | Status: DC | PRN

## 2024-01-30 MED ORDER — ACETAMINOPHEN 10 MG/ML IV SOLN
1000.0000 mg | Freq: Four times a day (QID) | INTRAVENOUS | Status: DC
  Administered 2024-01-30 – 2024-01-31 (×3): 1000 mg via INTRAVENOUS
  Filled 2024-01-30 (×2): qty 100

## 2024-01-30 MED ORDER — CHLORHEXIDINE GLUCONATE 4 % EX SOLN: 1.0000 | CUTANEOUS | 1 refills | Status: DC

## 2024-01-30 MED ORDER — ALUM & MAG HYDROXIDE-SIMETH 200-200-20 MG/5ML PO SUSP: 30.0000 mL | ORAL | Status: DC | PRN

## 2024-01-30 MED ORDER — CEFAZOLIN SODIUM-DEXTROSE 2-4 GM/100ML-% IV SOLN
INTRAVENOUS | Status: AC
  Filled 2024-01-30: qty 100

## 2024-01-30 MED ORDER — BISACODYL 10 MG RE SUPP: 10.0000 mg | Freq: Every day | RECTAL | Status: DC | PRN

## 2024-01-30 MED ORDER — CEFAZOLIN SODIUM-DEXTROSE 2-4 GM/100ML-% IV SOLN
2.0000 g | Freq: Four times a day (QID) | INTRAVENOUS | Status: AC
  Administered 2024-01-30 – 2024-01-31 (×2): 2 g via INTRAVENOUS
  Filled 2024-01-30 (×2): qty 100

## 2024-01-30 MED ORDER — CELECOXIB 200 MG PO CAPS
ORAL_CAPSULE | ORAL | Status: AC
  Filled 2024-01-30: qty 2

## 2024-01-30 MED ORDER — SURGIRINSE WOUND IRRIGATION SYSTEM - OPTIME
TOPICAL | Status: DC | PRN
  Administered 2024-01-30: 450 mL via TOPICAL

## 2024-01-30 MED ORDER — OXYCODONE HCL 5 MG PO TABS: 10.0000 mg | ORAL_TABLET | ORAL | Status: DC | PRN

## 2024-01-30 MED ORDER — CHLORHEXIDINE GLUCONATE 0.12 % MT SOLN
OROMUCOSAL | Status: AC
  Filled 2024-01-30: qty 15

## 2024-01-30 MED ORDER — MIDAZOLAM HCL 2 MG/2ML IJ SOLN
INTRAMUSCULAR | Status: AC
  Filled 2024-01-30: qty 2

## 2024-01-30 MED ORDER — PHENOL 1.4 % MT LIQD: 1.0000 | OROMUCOSAL | Status: DC | PRN

## 2024-01-30 MED ORDER — ACETAMINOPHEN 325 MG PO TABS: 325.0000 mg | ORAL_TABLET | Freq: Four times a day (QID) | ORAL | Status: DC | PRN

## 2024-01-30 MED ORDER — BUPIVACAINE HCL (PF) 0.5 % IJ SOLN
INTRAMUSCULAR | Status: DC | PRN
  Administered 2024-01-30: 3 mL

## 2024-01-30 MED ORDER — SENNOSIDES-DOCUSATE SODIUM 8.6-50 MG PO TABS
1.0000 | ORAL_TABLET | Freq: Two times a day (BID) | ORAL | Status: DC
  Administered 2024-01-30 – 2024-01-31 (×2): 1 via ORAL
  Filled 2024-01-30 (×2): qty 1

## 2024-01-30 MED ORDER — TRAMADOL HCL 50 MG PO TABS: 50.0000 mg | ORAL_TABLET | ORAL | Status: DC | PRN

## 2024-01-30 MED ORDER — MUPIROCIN 2 % EX OINT: 1.0000 | TOPICAL_OINTMENT | Freq: Two times a day (BID) | CUTANEOUS | 0 refills | Status: AC

## 2024-01-30 MED ORDER — ONDANSETRON HCL 4 MG/2ML IJ SOLN: 4.0000 mg | Freq: Four times a day (QID) | INTRAMUSCULAR | Status: DC | PRN

## 2024-01-30 MED ORDER — INSULIN ASPART 100 UNIT/ML IJ SOLN
0.0000 [IU] | Freq: Three times a day (TID) | INTRAMUSCULAR | Status: DC
  Administered 2024-01-31: 2 [IU] via SUBCUTANEOUS
  Filled 2024-01-30: qty 1

## 2024-01-30 MED ORDER — PROPOFOL 1000 MG/100ML IV EMUL
INTRAVENOUS | Status: AC
  Filled 2024-01-30: qty 100

## 2024-01-30 MED ORDER — BUPIVACAINE HCL (PF) 0.25 % IJ SOLN
INTRAMUSCULAR | Status: DC | PRN
  Administered 2024-01-30: 60 mL

## 2024-01-30 MED ORDER — SODIUM CHLORIDE 0.9 % IR SOLN
Status: DC | PRN
  Administered 2024-01-30: 3000 mL

## 2024-01-30 MED ORDER — TRANEXAMIC ACID-NACL 1000-0.7 MG/100ML-% IV SOLN
1000.0000 mg | Freq: Once | INTRAVENOUS | Status: AC
  Administered 2024-01-30: 1000 mg via INTRAVENOUS

## 2024-01-30 MED ORDER — ENSURE PRE-SURGERY PO LIQD
296.0000 mL | Freq: Once | ORAL | Status: AC
  Administered 2024-01-30: 296 mL via ORAL
  Filled 2024-01-30: qty 296

## 2024-01-30 MED ORDER — DEXAMETHASONE SODIUM PHOSPHATE 10 MG/ML IJ SOLN
INTRAMUSCULAR | Status: AC
Start: 2024-01-30 — End: ?
  Filled 2024-01-30: qty 1

## 2024-01-30 MED ORDER — METOCLOPRAMIDE HCL 10 MG PO TABS
10.0000 mg | ORAL_TABLET | Freq: Three times a day (TID) | ORAL | Status: DC
  Administered 2024-01-30 – 2024-01-31 (×2): 10 mg via ORAL
  Filled 2024-01-30 (×2): qty 1

## 2024-01-30 MED ORDER — BUPIVACAINE HCL (PF) 0.5 % IJ SOLN
INTRAMUSCULAR | Status: AC
  Filled 2024-01-30: qty 10

## 2024-01-30 MED ORDER — ASPIRIN 81 MG PO CHEW
81.0000 mg | CHEWABLE_TABLET | Freq: Two times a day (BID) | ORAL | Status: DC
  Administered 2024-01-30 – 2024-01-31 (×2): 81 mg via ORAL
  Filled 2024-01-30 (×2): qty 1

## 2024-01-30 MED ORDER — FERROUS SULFATE 325 (65 FE) MG PO TABS
325.0000 mg | ORAL_TABLET | Freq: Two times a day (BID) | ORAL | Status: DC
  Administered 2024-01-31: 325 mg via ORAL
  Filled 2024-01-30: qty 1

## 2024-01-30 MED ORDER — FENTANYL CITRATE (PF) 100 MCG/2ML IJ SOLN
INTRAMUSCULAR | Status: AC
  Filled 2024-01-30: qty 2

## 2024-01-30 SURGICAL SUPPLY — 67 items
ATTUNE MED DOME PAT 32 KNEE (Knees) IMPLANT
ATTUNE PS FEM RT SZ 4 CEM KNEE (Femur) IMPLANT
ATTUNE PSRP INSR SZ4 7 KNEE (Insert) IMPLANT
BASEPLATE TIBIAL ROTATING SZ 4 (Knees) IMPLANT
BATTERY INSTRU NAVIGATION (MISCELLANEOUS) ×8 IMPLANT
BIT DRILL QUICK REL 1/8 2PK SL (BIT) ×2 IMPLANT
BLADE CLIPPER SURG (BLADE) IMPLANT
BLADE SAW 70X12.5 (BLADE) ×2 IMPLANT
BLADE SAW 90X13X1.19 OSCILLAT (BLADE) ×2 IMPLANT
BLADE SAW 90X25X1.19 OSCILLAT (BLADE) ×2 IMPLANT
BONE CEMENT GENTAMICIN (Cement) ×2 IMPLANT
BRUSH SCRUB EZ PLAIN DRY (MISCELLANEOUS) ×2 IMPLANT
CEMENT BONE GENTAMICIN 40 (Cement) IMPLANT
COOLER POLAR GLACIER W/PUMP (MISCELLANEOUS) ×2 IMPLANT
CUFF TRNQT CYL 24X4X16.5-23 (TOURNIQUET CUFF) IMPLANT
CUFF TRNQT CYL 30X4X21-28X (TOURNIQUET CUFF) IMPLANT
DRAPE SHEET LG 3/4 BI-LAMINATE (DRAPES) ×2 IMPLANT
DRSG AQUACEL AG ADV 3.5X14 (GAUZE/BANDAGES/DRESSINGS) ×2 IMPLANT
DRSG MEPILEX SACRM 8.7X9.8 (GAUZE/BANDAGES/DRESSINGS) ×2 IMPLANT
DRSG TEGADERM 4X4.75 (GAUZE/BANDAGES/DRESSINGS) ×2 IMPLANT
DRSG XEROFORM 1X8 (GAUZE/BANDAGES/DRESSINGS) IMPLANT
DURAPREP 26ML APPLICATOR (WOUND CARE) ×4 IMPLANT
ELECT CAUTERY BLADE 6.4 (BLADE) ×2 IMPLANT
ELECT REM PT RETURN 9FT ADLT (ELECTROSURGICAL) ×1 IMPLANT
ELECTRODE REM PT RTRN 9FT ADLT (ELECTROSURGICAL) ×2 IMPLANT
EVACUATOR 1/8 PVC DRAIN (DRAIN) ×2 IMPLANT
EX-PIN ORTHOLOCK NAV 4X150 (PIN) ×4 IMPLANT
GAUZE XEROFORM 1X8 LF (GAUZE/BANDAGES/DRESSINGS) ×2 IMPLANT
GLOVE BIOGEL M STRL SZ7.5 (GLOVE) ×12 IMPLANT
GLOVE SURG UNDER POLY LF SZ8 (GLOVE) ×4 IMPLANT
GOWN STRL REUS W/ TWL LRG LVL3 (GOWN DISPOSABLE) ×2 IMPLANT
GOWN STRL REUS W/ TWL XL LVL3 (GOWN DISPOSABLE) ×2 IMPLANT
GOWN TOGA ZIPPER T7+ PEEL AWAY (MISCELLANEOUS) ×2 IMPLANT
HOLDER FOLEY CATH W/STRAP (MISCELLANEOUS) ×2 IMPLANT
HOOD PEEL AWAY T7 (MISCELLANEOUS) ×2 IMPLANT
KIT TURNOVER KIT A (KITS) ×2 IMPLANT
KNIFE SCULPS 14X20 (INSTRUMENTS) ×2 IMPLANT
MANIFOLD NEPTUNE II (INSTRUMENTS) ×4 IMPLANT
NDL SPNL 20GX3.5 QUINCKE YW (NEEDLE) ×4 IMPLANT
NEEDLE SPNL 20GX3.5 QUINCKE YW (NEEDLE) ×2 IMPLANT
PACK TOTAL KNEE (MISCELLANEOUS) ×2 IMPLANT
PAD ABD DERMACEA PRESS 5X9 (GAUZE/BANDAGES/DRESSINGS) ×4 IMPLANT
PAD ARMBOARD POSITIONER FOAM (MISCELLANEOUS) ×6 IMPLANT
PAD WRAPON POLAR KNEE (MISCELLANEOUS) ×2 IMPLANT
PENCIL SMOKE EVACUATOR COATED (MISCELLANEOUS) ×2 IMPLANT
PIN DRILL FIX HALF THREAD (BIT) ×4 IMPLANT
PIN FIXATION 1/8DIA X 3INL (PIN) ×2 IMPLANT
PULSAVAC PLUS IRRIG FAN TIP (DISPOSABLE) ×1 IMPLANT
SOL .9 NS 3000ML IRR UROMATIC (IV SOLUTION) ×2 IMPLANT
SOLUTION IRRIG SURGIPHOR (IV SOLUTION) ×2 IMPLANT
SPONGE DRAIN TRACH 4X4 STRL 2S (GAUZE/BANDAGES/DRESSINGS) ×2 IMPLANT
STAPLER SKIN PROX 35W (STAPLE) ×2 IMPLANT
STOCKINETTE IMPERV 14X48 (MISCELLANEOUS) ×2 IMPLANT
STOCKINETTE STRL BIAS CUT 8X4 (MISCELLANEOUS) ×2 IMPLANT
STRAP TIBIA SHORT (MISCELLANEOUS) ×2 IMPLANT
SUCTION TUBE FRAZIER 10FR DISP (SUCTIONS) ×2 IMPLANT
SUT VIC AB 0 CT1 36 (SUTURE) ×2 IMPLANT
SUT VIC AB 1 CT1 36 (SUTURE) ×4 IMPLANT
SUT VIC AB 2-0 CT2 27 (SUTURE) ×2 IMPLANT
SYR 30ML LL (SYRINGE) ×4 IMPLANT
TIP FAN IRRIG PULSAVAC PLUS (DISPOSABLE) ×2 IMPLANT
TOWEL OR 17X26 4PK STRL BLUE (TOWEL DISPOSABLE) ×2 IMPLANT
TOWER CARTRIDGE SMART MIX (DISPOSABLE) ×2 IMPLANT
TRAP FLUID SMOKE EVACUATOR (MISCELLANEOUS) ×2 IMPLANT
TRAY FOLEY MTR SLVR 16FR STAT (SET/KITS/TRAYS/PACK) ×2 IMPLANT
WATER STERILE IRR 1000ML POUR (IV SOLUTION) ×2 IMPLANT
WRAPON POLAR PAD KNEE (MISCELLANEOUS) ×1 IMPLANT

## 2024-01-30 NOTE — Progress Notes (Signed)
 PT Cancellation Note  Patient Details Name: Annette Bradford MRN: [id number] DOB: [birth year]   Cancelled Treatment:    Reason Eval/Treat Not Completed: Other (comment).  PT consult received.  Chart reviewed.  Pt currently in PACU s/p R TKA.  Per discussion with nursing, pt does not have adequate strength return (s/p spinal anesthesia) for therapy participation at this time.  Will re-attempt PT evaluation tomorrow.  Hendricks Limes, PT 01/30/24, 4:38 PM

## 2024-01-30 NOTE — Anesthesia Procedure Notes (Signed)
 Spinal  Patient location during procedure: OR Start time: 01/30/2024 11:23 AM End time: 01/30/2024 11:25 AM Reason for block: surgical anesthesia Staffing Anesthesiologist: Yevette Edwards, MD Performed by: Yevette Edwards, MD Authorized by: Yevette Edwards, MD   Preanesthetic Checklist Completed: patient identified, IV checked, site marked, risks and benefits discussed, surgical consent, monitors and equipment checked, pre-op evaluation and timeout performed Spinal Block Patient position: sitting Prep: DuraPrep Patient monitoring: heart rate, cardiac monitor, continuous pulse ox and blood pressure Approach: midline Location: L3-4 Injection technique: single-shot Needle Needle type: Sprotte and Pencan  Needle gauge: 24 G Needle length: 10 cm Assessment Sensory level: T4 Events: CSF return

## 2024-01-30 NOTE — Plan of Care (Signed)
  Problem: Nutritional: Goal: Maintenance of adequate nutrition will improve Outcome: Progressing   Problem: Skin Integrity: Goal: Risk for impaired skin integrity will decrease Outcome: Progressing   Problem: Tissue Perfusion: Goal: Adequacy of tissue perfusion will improve Outcome: Progressing   Problem: Activity: Goal: Ability to avoid complications of mobility impairment will improve Outcome: Progressing   Problem: Pain Management: Goal: Pain level will decrease with appropriate interventions Outcome: Progressing   Problem: Skin Integrity: Goal: Will show signs of wound healing Outcome: Progressing

## 2024-01-30 NOTE — Interval H&P Note (Signed)
 History and Physical Interval Note:  01/30/2024 11:04 AM  Annette Bradford  has presented today for surgery, with the diagnosis of PRIMARY OSTEOARTHRITIS OF RIGHT KNEE..  The various methods of treatment have been discussed with the patient and family. After consideration of risks, benefits and other options for treatment, the patient has consented to  Procedure(s): ARTHROPLASTY, KNEE, TOTAL, USING IMAGELESS COMPUTER-ASSISTED NAVIGATION (Right) as a surgical intervention.  The patient's history has been reviewed, patient examined, no change in status, stable for surgery.  I have reviewed the patient's chart and labs.  Questions were answered to the patient's satisfaction.     Josiah Nieto P Milt Coye

## 2024-01-30 NOTE — Anesthesia Preprocedure Evaluation (Signed)
 Anesthesia Evaluation  Patient identified by MRN, date of birth, ID band Patient awake    Reviewed: Allergy & Precautions, H&P , NPO status , Patient's Chart, lab work & pertinent test results, reviewed documented beta blocker date and time   Airway Mallampati: II   Neck ROM: full    Dental  (+) Poor Dentition   Pulmonary neg pulmonary ROS, former smoker   Pulmonary exam normal        Cardiovascular Exercise Tolerance: Poor negative cardio ROS Normal cardiovascular exam Rhythm:regular Rate:Normal     Neuro/Psych negative neurological ROS  negative psych ROS   GI/Hepatic negative GI ROS, Neg liver ROS,,,  Endo/Other  negative endocrine ROS    Renal/GU negative Renal ROS  negative genitourinary   Musculoskeletal   Abdominal   Peds  Hematology  (+) Blood dyscrasia, anemia   Anesthesia Other Findings Past Medical History: No date: Anemia     Comment:  during pregnancy 04/16/2015: Avitaminosis D No date: Benign neoplasm of descending colon No date: Benign neoplasm of sigmoid colon 07/14/2020: Bilateral primary osteoarthritis of knee 04/16/2015: Borderline diabetes 04/16/2015: Fibroid 04/23/2016: Hypercholesterolemia with hypertriglyceridemia 01/28/2022: Nose colonized with MRSA     Comment:  a.) surgical PCR (+) 01/28/2022 prior to LEFT TKA No date: Obesity (BMI 30-39.9) 04/16/2015: Post menopausal syndrome No date: Pre-diabetes No date: Rectal polyp Past Surgical History: 12/26/2015: COLONOSCOPY WITH PROPOFOL; N/A     Comment:  Procedure: COLONOSCOPY WITH PROPOFOL;  Surgeon: Midge Minium, MD;  Location: [facility];  Service:               Endoscopy;  Laterality: N/A; 09/10/2022: COLONOSCOPY WITH PROPOFOL; N/A     Comment:  Procedure: COLONOSCOPY WITH PROPOFOL;  Surgeon: Midge Minium, MD;  Location: [facility];  Service:               Endoscopy;  Laterality:  N/A; 02/10/2022: KNEE ARTHROPLASTY; Left     Comment:  Procedure: COMPUTER ASSISTED TOTAL KNEE ARTHROPLASTY;                Surgeon: Donato Heinz, MD;  Location: [facility];                Service: Orthopedics;  Laterality: Left; 12/26/2015: POLYPECTOMY     Comment:  Procedure: POLYPECTOMY;  Surgeon: Midge Minium, MD;                Location: [facility];  Service: Endoscopy;; BMI    Body Mass Index: 40.04 kg/m     Reproductive/Obstetrics negative OB ROS                             Anesthesia Physical Anesthesia Plan  ASA: 3  Anesthesia Plan: Spinal   Post-op Pain Management:    Induction:   PONV Risk Score and Plan: 3  Airway Management Planned:   Additional Equipment:   Intra-op Plan:   Post-operative Plan:   Informed Consent: I have reviewed the patients History and Physical, chart, labs and discussed the procedure including the risks, benefits and alternatives for the proposed anesthesia with the patient or authorized representative who has indicated his/her understanding and acceptance.     Dental Advisory Given  Plan Discussed with: CRNA  Anesthesia Plan Comments:  Anesthesia Quick Evaluation

## 2024-01-30 NOTE — Op Note (Signed)
 OPERATIVE NOTE  DATE OF SURGERY:  01/30/2024  PATIENT NAME:  Annette Bradford   DOB: 12-Aug-1966  MRN: [id number]  PRE-OPERATIVE DIAGNOSIS: Degenerative arthrosis of the right knee, primary  POST-OPERATIVE DIAGNOSIS:  Same  PROCEDURE:  Right total knee arthroplasty using computer-assisted navigation  SURGEON:  Jena Gauss. M.D.  ASSISTANT:  Gean Birchwood, PA-C (present and scrubbed throughout the case, critical for assistance with exposure, retraction, instrumentation, and closure)  ANESTHESIA: spinal  ESTIMATED BLOOD LOSS: 50 mL  FLUIDS REPLACED: 1300 mL of crystalloid  TOURNIQUET TIME: 100 minutes  DRAINS: 2 medium Hemovac drains  SOFT TISSUE RELEASES: Anterior cruciate ligament, posterior cruciate ligament, deep medial collateral ligament, patellofemoral ligament  IMPLANTS UTILIZED: DePuy Attune size 4 posterior stabilized femoral component (cemented), size 4 rotating platform tibial component (cemented), 32 mm medialized dome patella (cemented), and a 7 mm stabilized rotating platform polyethylene insert.  INDICATIONS FOR SURGERY: Annette Bradford is a 57 y.o. year old female with a long history of progressive knee pain. X-rays demonstrated severe degenerative changes in tricompartmental fashion. The patient had not seen any significant improvement despite conservative nonsurgical intervention. After discussion of the risks and benefits of surgical intervention, the patient expressed understanding of the risks benefits and agree with plans for total knee arthroplasty.   The risks, benefits, and alternatives were discussed at length including but not limited to the risks of infection, bleeding, nerve injury, stiffness, blood clots, the need for revision surgery, cardiopulmonary complications, among others, and they were willing to proceed.  PROCEDURE IN DETAIL: The patient was brought into the operating room and, after adequate spinal anesthesia was achieved, a  tourniquet was placed on the patient's upper thigh. The patient's knee and leg were cleaned and prepped with alcohol and DuraPrep and draped in the usual sterile fashion. A "timeout" was performed as per usual protocol. The lower extremity was exsanguinated using an Esmarch, and the tourniquet was inflated to 300 mmHg. An anterior longitudinal incision was made followed by a standard mid vastus approach. The deep fibers of the medial collateral ligament were elevated in a subperiosteal fashion off of the medial flare of the tibia so as to maintain a continuous soft tissue sleeve. The patella was subluxed laterally and the patellofemoral ligament was incised. Inspection of the knee demonstrated severe degenerative changes with full-thickness loss of articular cartilage. Osteophytes were debrided using a rongeur. Anterior and posterior cruciate ligaments were excised. Two 4.0 mm Schanz pins were inserted in the femur and into the tibia for attachment of the array of trackers used for computer-assisted navigation. Hip center was identified using a circumduction technique. Distal landmarks were mapped using the computer. The distal femur and proximal tibia were mapped using the computer. The distal femoral cutting guide was positioned using computer-assisted navigation so as to achieve a 5 distal valgus cut. The femur was sized and it was felt that a size 4 femoral component was appropriate. A size 4 femoral cutting guide was positioned and the anterior cut was performed and verified using the computer. This was followed by completion of the posterior and chamfer cuts. Femoral cutting guide for the central box was then positioned in the center box cut was performed.  Attention was then directed to the proximal tibia. Medial and lateral menisci were excised. The extramedullary tibial cutting guide was positioned using computer-assisted navigation so as to achieve a 0 varus-valgus alignment and 3 posterior slope. The  cut was performed and verified using the computer. The proximal tibia  was sized and it was felt that a size 4 tibial tray was appropriate. Tibial and femoral trials were inserted followed by insertion of a 7 mm polyethylene insert. This allowed for excellent mediolateral soft tissue balancing both in flexion and in full extension. Finally, the patella was cut and prepared so as to accommodate a 32 mm medialized dome patella. A patella trial was placed and the knee was placed through a range of motion with excellent patellar tracking appreciated. The femoral trial was removed after debridement of posterior osteophytes. The central post-hole for the tibial component was reamed followed by insertion of a keel punch. Tibial trials were then removed. Cut surfaces of bone were irrigated with copious amounts of normal saline using pulsatile lavage and then suctioned dry. Polymethylmethacrylate cement with gentamicin was prepared in the usual fashion using a vacuum mixer. Cement was applied to the cut surface of the proximal tibia as well as along the undersurface of a size 4 rotating platform tibial component. Tibial component was positioned and impacted into place. Excess cement was removed using Personal assistant. Cement was then applied to the cut surfaces of the femur as well as along the posterior flanges of the size 4 femoral component. The femoral component was positioned and impacted into place. Excess cement was removed using Personal assistant. A 7 mm polyethylene trial was inserted and the knee was brought into full extension with steady axial compression applied. Finally, cement was applied to the backside of a 32 mm medialized dome patella and the patellar component was positioned and patellar clamp applied. Excess cement was removed using Personal assistant. After adequate curing of the cement, the tourniquet was deflated after a total tourniquet time of 100 minutes. Hemostasis was achieved using electrocautery. The  knee was irrigated with copious amounts of normal saline using pulsatile lavage followed by 450 ml of Surgiphor and then suctioned dry. 20 mL of 1.3% Exparel and 60 mL of 0.25% Marcaine in 40 mL of normal saline was injected along the posterior capsule, medial and lateral gutters, and along the arthrotomy site. A 7 mm stabilized rotating platform polyethylene insert was inserted and the knee was placed through a range of motion with excellent mediolateral soft tissue balancing appreciated and excellent patellar tracking noted. 2 medium drains were placed in the wound bed and brought out through separate stab incisions. The medial parapatellar portion of the incision was reapproximated using interrupted sutures of #1 Vicryl. Subcutaneous tissue was approximated in layers using first #0 Vicryl followed #2-0 Vicryl. The skin was approximated with skin staples. A sterile dressing was applied.  The patient tolerated the procedure well and was transported to the recovery room in stable condition.    Helen Cuff P. Angie Fava., M.D.

## 2024-01-30 NOTE — Transfer of Care (Signed)
 Immediate Anesthesia Transfer of Care Note  Patient: Annette Bradford  Procedure(s) Performed: ARTHROPLASTY, KNEE, TOTAL, USING IMAGELESS COMPUTER-ASSISTED NAVIGATION (Right: Knee)  Patient Location: PACU  Anesthesia Type:General  Level of Consciousness: awake, alert , and oriented  Airway & Oxygen Therapy: Patient Spontanous Breathing and Patient connected to face mask oxygen  Post-op Assessment: Report given to RN and Post -op Vital signs reviewed and stable  Post vital signs: Reviewed and stable  Last Vitals:  Vitals Value Taken Time  BP 96/76 01/30/24 1512  Temp    Pulse 92 01/30/24 1514  Resp 28 01/30/24 1514  SpO2 98 % 01/30/24 1514  Vitals shown include unfiled device data.  Last Pain:  Vitals:   01/30/24 0920  PainSc: 0-No pain         Complications: No notable events documented.

## 2024-01-31 ENCOUNTER — Encounter: Payer: Self-pay | Admitting: Orthopedic Surgery

## 2024-01-31 DIAGNOSIS — M1711 Unilateral primary osteoarthritis, right knee: Secondary | ICD-10-CM | POA: Diagnosis not present

## 2024-01-31 LAB — GLUCOSE, CAPILLARY: Glucose-Capillary: 141 mg/dL — ABNORMAL HIGH (ref 70–99)

## 2024-01-31 MED ORDER — CELECOXIB 200 MG PO CAPS: 200.0000 mg | ORAL_CAPSULE | Freq: Two times a day (BID) | ORAL | 1 refills | Status: DC

## 2024-01-31 MED ORDER — OXYCODONE HCL 5 MG PO TABS: 5.0000 mg | ORAL_TABLET | ORAL | 0 refills | Status: DC | PRN

## 2024-01-31 MED ORDER — ACETAMINOPHEN 10 MG/ML IV SOLN
INTRAVENOUS | Status: AC
  Filled 2024-01-31: qty 100

## 2024-01-31 MED ORDER — ASPIRIN 81 MG PO CHEW: 81.0000 mg | CHEWABLE_TABLET | Freq: Two times a day (BID) | ORAL | Status: AC

## 2024-01-31 MED ORDER — TRAMADOL HCL 50 MG PO TABS: 50.0000 mg | ORAL_TABLET | ORAL | 0 refills | Status: DC | PRN

## 2024-01-31 NOTE — Progress Notes (Signed)
 DISCHARGE NOTE:  Pt dc with IV removed and education given. Pt had TED hose on and in place as well as polar care. Pt was given 2 honeycomb dressings, CHG soap, and mediation scripts. Pt wheeled down by staff and pt's husband provided transportation.

## 2024-01-31 NOTE — Anesthesia Postprocedure Evaluation (Signed)
 Anesthesia Post Note  Patient: Annette Bradford  Procedure(s) Performed: ARTHROPLASTY, KNEE, TOTAL, USING IMAGELESS COMPUTER-ASSISTED NAVIGATION (Right: Knee)  Patient location during evaluation: Nursing Unit Anesthesia Type: Spinal Level of consciousness: oriented and awake and alert Pain management: pain level controlled Vital Signs Assessment: post-procedure vital signs reviewed and stable Respiratory status: spontaneous breathing and respiratory function stable Cardiovascular status: blood pressure returned to baseline and stable Postop Assessment: no headache, no backache, no apparent nausea or vomiting and able to ambulate Anesthetic complications: no   No notable events documented.   Last Vitals:  Vitals:   01/31/24 0037 01/31/24 0429  BP: 91/61 102/69  Pulse: 76 83  Resp: 18 18  Temp: 36.7 C 36.9 C  SpO2: 97% 96%    Last Pain:  Vitals:   01/31/24 0429  TempSrc: Temporal  PainSc:                  Jules Schick

## 2024-01-31 NOTE — Anesthesia Postprocedure Evaluation (Signed)
 Anesthesia Post Note  Patient: Annette Bradford  Procedure(s) Performed: ARTHROPLASTY, KNEE, TOTAL, USING IMAGELESS COMPUTER-ASSISTED NAVIGATION (Right: Knee)  Patient location during evaluation: PACU Anesthesia Type: Spinal Level of consciousness: awake and alert Pain management: pain level controlled Vital Signs Assessment: post-procedure vital signs reviewed and stable Respiratory status: spontaneous breathing, nonlabored ventilation, respiratory function stable and patient connected to nasal cannula oxygen Cardiovascular status: blood pressure returned to baseline and stable Postop Assessment: no apparent nausea or vomiting Anesthetic complications: no   No notable events documented.   Last Vitals:  Vitals:   01/31/24 0429 01/31/24 0757  BP: 102/69 109/72  Pulse: 83 92  Resp: 18 17  Temp: 36.9 C 36.6 C  SpO2: 96% 98%    Last Pain:  Vitals:   01/31/24 0757  TempSrc: Oral  PainSc: 0-No pain                 Yevette Edwards

## 2024-01-31 NOTE — Progress Notes (Signed)
 Subjective: 1 Day Post-Op Procedure(s) (LRB): ARTHROPLASTY, KNEE, TOTAL, USING IMAGELESS COMPUTER-ASSISTED NAVIGATION (Right) Patient reports pain as mild.   Patient seen in rounds with Dr. Ernest Pine. Patient is well, and has had no acute complaints or problems. Denies any CP, SOB, N/V, fevers or chills We will start therapy today.  Plan is to go Home after hospital stay.  Objective: Vital signs in last 24 hours: Temp:  [97.4 F (36.3 C)-98.5 F (36.9 C)] 98.5 F (36.9 C) (04/01 0429) Pulse Rate:  [76-97] 83 (04/01 0429) Resp:  [16-28] 18 (04/01 0429) BP: (91-116)/(61-87) 102/69 (04/01 0429) SpO2:  [93 %-100 %] 96 % (04/01 0429) Weight:  [99.3 kg] 99.3 kg (03/31 1541)  Intake/Output from previous day:  Intake/Output Summary (Last 24 hours) at 01/31/2024 0753 Last data filed at 01/31/2024 0430 Gross per 24 hour  Intake 2644.97 ml  Output 810 ml  Net 1834.97 ml    Intake/Output this shift: No intake/output data recorded.  Labs: No results for input(s): "HGB" in the last 72 hours. No results for input(s): "WBC", "RBC", "HCT", "PLT" in the last 72 hours. No results for input(s): "NA", "K", "CL", "CO2", "BUN", "CREATININE", "GLUCOSE", "CALCIUM" in the last 72 hours. No results for input(s): "LABPT", "INR" in the last 72 hours.  EXAM General - Patient is Alert, Appropriate, and Oriented Extremity - Neurologically intact Neurovascular intact Sensation intact distally Intact pulses distally Dorsiflexion/Plantar flexion intact No cellulitis present Compartment soft Dressing - dressing C/D/I and no drainage Motor Function - intact, moving foot and toes well on exam.  JP Drain pulled without difficulty. Intact  Past Medical History:  Diagnosis Date   Anemia    during pregnancy   Avitaminosis D 04/16/2015   Benign neoplasm of descending colon    Benign neoplasm of sigmoid colon    Bilateral primary osteoarthritis of knee 07/14/2020   Borderline diabetes 04/16/2015   Fibroid  04/16/2015   Hypercholesterolemia with hypertriglyceridemia 04/23/2016   Nose colonized with MRSA 01/28/2022   a.) surgical PCR (+) 01/28/2022 prior to LEFT TKA   Obesity (BMI 30-39.9)    Post menopausal syndrome 04/16/2015   Pre-diabetes    Rectal polyp     Assessment/Plan: 1 Day Post-Op Procedure(s) (LRB): ARTHROPLASTY, KNEE, TOTAL, USING IMAGELESS COMPUTER-ASSISTED NAVIGATION (Right) Principal Problem:   History of total knee arthroplasty, right  Estimated body mass index is 40.04 kg/m as calculated from the following:   Height as of this encounter: 5\' 2"  (1.575 m).   Weight as of this encounter: 99.3 kg. Advance diet Up with therapy  Patient will continue to work with physical therapy to pass postoperative PT protocols, ROM and strengthening  Discussed with the patient continuing to utilize Polar Care  Patient will use bone foam in 20-30 minute intervals  Patient will wear TED hose bilaterally to help prevent DVT and clot formation  Discussed the Aquacel bandage.  This bandage will stay in place 7 days postoperatively.  Can be replaced with honeycomb bandages that will be sent home with the patient  Discussed sending the patient home with tramadol and oxycodone for as needed pain management.  Patient will also be sent home with Celebrex to help with swelling and inflammation.  Patient will take an 81 mg aspirin twice daily for DVT prophylaxis  JP drain removed without difficulty, intact  Weight-Bearing as tolerated to right leg  Patient will follow-up with [facility] orth[facility] in 2 weeks for staple removal and reevaluation  Rayburn Go, PA-C [facility] 01/31/2024, 7:53  AM

## 2024-01-31 NOTE — Progress Notes (Signed)
 Occupational Therapy Evaluation Patient Details Name: Annette Bradford MRN: [id number] DOB: 1965/12/18 Today's Date: 01/31/2024   History of Present Illness   Pt is a 58 y.o. female s/p elective R TKA on 01/31/24.     Clinical Impressions Pt seen for OT evaluation this date, POD#1 from above surgery. Pt was independent in all ADLs prior to surgery, working as CNA. Pt is eager to return to PLOF with less pain and improved safety and independence. Pt currently requires minimal assist for LB dressing of compression socks while in seated position due to pain and limited AROM of R knee. Pt amb with supervision to BR pushing IV pole, toilet transfer completed with supervision, no physical assistance required for mobility. Pt instructed in polar care mgt, falls prevention strategies, home/routines modifications, DME/AE for LB bathing and dressing tasks, and compression stocking mgt. Pt is confident is her ability to complete all ADLs and receive assistance from husband PRN for IADLs. Do not currently anticipate any OT needs following this hospitalization.  No further OT needs at this time, OT will sign off.      If plan is discharge home, recommend the following:   A little help with bathing/dressing/bathroom;Assistance with cooking/housework     Functional Status Assessment   Patient has not had a recent decline in their functional status     Equipment Recommendations   None recommended by OT     Recommendations for Other Services         Precautions/Restrictions   Precautions Precautions: Knee Recall of Precautions/Restrictions: Intact Restrictions Weight Bearing Restrictions Per Provider Order: Yes RLE Weight Bearing Per Provider Order: Weight bearing as tolerated     Mobility Bed Mobility               General bed mobility comments: NT in recliner pre/post session    Transfers Overall transfer level: Supervision Equipment used:  (IV pole)                General transfer comment: No physical assistance required during transfers      Balance Overall balance assessment: Modified Independent                                         ADL either performed or assessed with clinical judgement   ADL Overall ADL's : Needs assistance/impaired     Grooming: Wash/dry hands;Standing               Lower Body Dressing: Sit to/from stand;Supervision/safety   Toilet Transfer: Supervision/safety;Regular Toilet (Pt pushing IV pole during amb)           Functional mobility during ADLs: Supervision/safety (Pt pushing IV pole during amb) General ADL Comments: Donned LE dressing with MINA for R compression sock only. Supervision with donning shoes while seated in recliner     Vision         Perception         Praxis         Pertinent Vitals/Pain Pain Assessment Pain Assessment: 0-10 Pain Score: 2  Pain Location: R knee operative site Pain Descriptors / Indicators: Operative site guarding Pain Intervention(s): Limited activity within patient's tolerance, Repositioned     Extremity/Trunk Assessment Upper Extremity Assessment Upper Extremity Assessment: Overall WFL for tasks assessed   Lower Extremity Assessment Lower Extremity Assessment: RLE deficits/detail RLE Deficits / Details: Expected deficits appropriate post R TKA  Cervical / Trunk Assessment Cervical / Trunk Assessment: Normal   Communication Communication Communication: No apparent difficulties   Cognition Arousal: Alert Behavior During Therapy: WFL for tasks assessed/performed Cognition: No apparent impairments             OT - Cognition Comments: a/o x4                 Following commands: Intact       Cueing  General Comments   Cueing Techniques: Verbal cues  Post operative site d/c/i pre/post session   Exercises Exercises: Other exercises Other Exercises Other Exercises: Edu: role of OT, WB review, safe ADL  completion, polar care instructions   Shoulder Instructions      Home Living Family/patient expects to be discharged to:: Private residence Living Arrangements: Spouse/significant other Available Help at Discharge: Family;Available 24 hours/day Type of Home: House Home Access: Stairs to enter [employer] of Steps: 2 Entrance Stairs-Rails: None Home Layout: Two level;Able to live on main level with bedroom/bathroom     Bathroom Shower/Tub: Chief Strategy Officer: Standard     Home Equipment: Agricultural consultant (2 wheels);BSC/3in1;Hospital bed;Wheelchair - manual          Prior Functioning/Environment Prior Level of Function : Independent/Modified Independent                    OT Problem List:     OT Treatment/Interventions:        OT Goals(Current goals can be found in the care plan section)   Acute Rehab OT Goals Patient Stated Goal: to feel stronger OT Goal Formulation: With patient Time For Goal Achievement: 02/14/24 Potential to Achieve Goals: Good   OT Frequency:       Co-evaluation              AM-PAC OT "6 Clicks" Daily Activity     Outcome Measure Help from another person eating meals?: None Help from another person taking care of personal grooming?: None Help from another person toileting, which includes using toliet, bedpan, or urinal?: None Help from another person bathing (including washing, rinsing, drying)?: A Little Help from another person to put on and taking off regular upper body clothing?: None Help from another person to put on and taking off regular lower body clothing?: A Little 6 Click Score: 22   End of Session Nurse Communication: Mobility status  Activity Tolerance: Patient tolerated treatment well Patient left: in chair;with call bell/phone within reach;with family/visitor present                   Time: 1610-9604 OT Time Calculation (min): 13 min Charges:  OT General Charges $OT Visit: 1  Visit OT Evaluation $OT Eval Low Complexity: 1 Low  Glenard Haring M.S. OTR/L  01/31/24, 9:58 AM

## 2024-01-31 NOTE — Evaluation (Signed)
 Physical Therapy Evaluation Patient Details Name: Annette Bradford MRN: [id number] DOB: [birth year] Today's Date: 01/31/2024  History of Present Illness  Pt is a 58 y.o. female s/p elective R TKA on 01/31/24.  Clinical Impression  Pt admitted with above diagnosis. Pt currently with functional limitations due to the deficits listed below (see PT Problem List). Pt received upright in bed agreeable and motivated to participate with PT. Prior to mobility educated pt on Wb status, LE positioning to prevent knee flexion contracture and education on HEP hand out. PTA pt reports being fully independent.   To date, pt indep with bed mobility, mod-I for STS to RW and ambulation of 300'. Min VC's for RLE heel strike at initial contact with good carryover post cuing. Pt completes steps as well mod-I needing initial min VC's for correct LE sequencing with excellent carryover post cues. Pt returns to room in recliner performing LE therex as listed below. Pt's current AROM 6-60 degrees. Reviewed importance of quad sets and heel slides to maximize AROM gains to normal limits. Pt placed in recliner with polar care donned and R knee in extension. All needs in reach. Pt will benefit from f/u PT recs to address acute deficits in R knee ROM, gait abnormality, and strength deficits.            If plan is discharge home, recommend the following: Assistance with cooking/housework;A little help with bathing/dressing/bathroom;Assist for transportation;Help with stairs or ramp for entrance   Can travel by private vehicle        Equipment Recommendations None recommended by PT  Recommendations for Other Services       Functional Status Assessment Patient has had a recent decline in their functional status and demonstrates the ability to make significant improvements in function in a reasonable and predictable amount of time.     Precautions / Restrictions Precautions Precautions: Knee Precaution Booklet Issued:  Yes (comment) Restrictions Weight Bearing Restrictions Per Provider Order: Yes RLE Weight Bearing Per Provider Order: Weight bearing as tolerated      Mobility  Bed Mobility Overal bed mobility: Independent               Patient Response: Cooperative  Transfers Overall transfer level: Modified independent Equipment used: Rolling walker (2 wheels)                    Ambulation/Gait Ambulation/Gait assistance: Modified independent (Device/Increase time) Gait Distance (Feet): 300 Feet Assistive device: Rolling walker (2 wheels) Gait Pattern/deviations: Step-through pattern, Decreased stance time - right, Antalgic Gait velocity: 2'/sec Gait velocity interpretation: 1.31 - 2.62 ft/sec, indicative of limited community ambulator   General Gait Details: VC's for consistent RLE heel strike at initial contact with good carryober. MIld antalgic gait.  Stairs Stairs: Yes Stairs assistance: Modified independent (Device/Increase time) Stair Management: Two rails, Step to pattern, Forwards Number of Stairs: 4 General stair comments: asc/desc. Min VC's for LE sequencing with good carryover after cuing.  Wheelchair Mobility     Tilt Bed Tilt Bed Patient Response: Cooperative  Modified Rankin (Stroke Patients Only)       Balance Overall balance assessment: Mild deficits observed, not formally tested                                           Pertinent Vitals/Pain Pain Assessment Pain Assessment: No/denies pain    Home Living Family/patient  expects to be discharged to:: Private residence Living Arrangements: Spouse/significant other Available Help at Discharge: Family;Available 24 hours/day Type of Home: House Home Access: Stairs to enter Entrance Stairs-Rails: None (Reports B cement posts similar to rails. Pt can reach both.) Entrance Stairs-Number of Steps: 2   Home Layout: Two level;Able to live on main level with bedroom/bathroom Home  Equipment: Rolling Walker (2 wheels);BSC/3in1;Hospital bed;Wheelchair - manual      Prior Function Prior Level of Function : Independent/Modified Independent                     Extremity/Trunk Assessment   Upper Extremity Assessment Upper Extremity Assessment: Overall WFL for tasks assessed    Lower Extremity Assessment Lower Extremity Assessment: RLE deficits/detail RLE Deficits / Details: Expected RLE ROM andf strength deficits appropriate after R TKA       Communication   Communication Communication: No apparent difficulties    Cognition Arousal: Alert Behavior During Therapy: WFL for tasks assessed/performed   PT - Cognitive impairments: No apparent impairments                         Following commands: Intact       Cueing Cueing Techniques: Verbal cues     General Comments      Exercises Total Joint Exercises Ankle Circles/Pumps: AROM, Both, 10 reps, Seated Quad Sets: AROM, Strengthening, Right, 10 reps, Supine Short Arc Quad: AROM, Strengthening, Right, 10 reps, Supine Heel Slides: AROM, Strengthening, Right, 10 reps, Supine Hip ABduction/ADduction: AROM, Strengthening, Right, 10 reps, Supine Straight Leg Raises: AROM, Strengthening, Right, 10 reps, Supine Long Arc Quad: AROM, Strengthening, Seated, Right, 10 reps Goniometric ROM: 6-60 Other Exercises Other Exercises: Role of PT in acute setting, d/c recs, WB status, reviewed stair training. Discussed HEP with hand out provided and education on reps/sets/frequency   Assessment/Plan    PT Assessment All further PT needs can be met in the next venue of care  PT Problem List Decreased strength;Decreased range of motion;Decreased mobility       PT Treatment Interventions      PT Goals (Current goals can be found in the Care Plan section)  Acute Rehab PT Goals Patient Stated Goal: to return home PT Goal Formulation: With patient Time For Goal Achievement: 02/14/24 Potential to  Achieve Goals: Good    Frequency       Co-evaluation               AM-PAC PT "6 Clicks" Mobility  Outcome Measure Help needed turning from your back to your side while in a flat bed without using bedrails?: None Help needed moving from lying on your back to sitting on the side of a flat bed without using bedrails?: None Help needed moving to and from a bed to a chair (including a wheelchair)?: None Help needed standing up from a chair using your arms (e.g., wheelchair or bedside chair)?: A Little Help needed to walk in hospital room?: A Little Help needed climbing 3-5 steps with a railing? : A Little 6 Click Score: 21    End of Session Equipment Utilized During Treatment: Gait belt Activity Tolerance: Patient tolerated treatment well Patient left: in chair;with call bell/phone within reach;with family/visitor present;with SCD's reapplied Nurse Communication: Mobility status PT Visit Diagnosis: Muscle weakness (generalized) (M62.81)    Time: 8295-6213 PT Time Calculation (min) (ACUTE ONLY): 23 min   Charges:   PT Evaluation $PT Eval Low Complexity: 1 Low PT  Treatments $Therapeutic Exercise: 8-22 mins PT General Charges $$ ACUTE PT VISIT: 1 Visit        Delphia Grates. Fairly IV, PT, DPT Physical Therapist- [place]  [facility]  01/31/2024, 9:13 AM

## 2024-01-31 NOTE — Plan of Care (Signed)
  Problem: Metabolic: Goal: Ability to maintain appropriate glucose levels will improve Outcome: Progressing   Problem: Activity: Goal: Ability to avoid complications of mobility impairment will improve Outcome: Progressing

## 2024-01-31 NOTE — Discharge Summary (Signed)
 Physician Discharge Summary  Subjective: 1 Day Post-Op Procedure(s) (LRB): ARTHROPLASTY, KNEE, TOTAL, USING IMAGELESS COMPUTER-ASSISTED NAVIGATION (Right) Patient reports pain as mild.   Patient seen in rounds with Dr. Ernest Pine. Patient is well, and has had no acute complaints or problems. Denies any CP, SOB, N/V, fevers or chills We will start therapy today.  Patient is ready to go home  Physician Discharge Summary  Patient ID: Annette Bradford MRN: [id number] DOB/AGE: June 04, 1966 58 y.o.  Admit date: 01/30/2024 Discharge date: 01/31/2024  Admission Diagnoses:  Discharge Diagnoses:  Principal Problem:   History of total knee arthroplasty, right   Discharged Condition: good  Hospital Course: Patient presented to the hospital on 01/30/2024 for an elective right total knee arthroplasty performed by Dr. Ernest Pine. Patient was given 1g of TXA and 2g of Ancef prior to the procedure. she tolerated the procedure well without any complications. See procedural note below for details. Postoperatively, the patient did very well. she was able to pass PT protocols on post-op day one without any issues. JP drain was removed without any difficulty and was intact. she was able to void her bladder without any difficulty. Physical exam was unremarkable. she denies any SOB, CP, N/V, fevers or chills. Vital signs are stable. Patient is stable to discharge home.   PROCEDURE:  Right total knee arthroplasty using computer-assisted navigation   SURGEON:  Jena Gauss. M.D.   ASSISTANT:  Gean Birchwood, PA-C (present and scrubbed throughout the case, critical for assistance with exposure, retraction, instrumentation, and closure)   ANESTHESIA: spinal   ESTIMATED BLOOD LOSS: 50 mL   FLUIDS REPLACED: 1300 mL of crystalloid   TOURNIQUET TIME: 100 minutes   DRAINS: 2 medium Hemovac drains   SOFT TISSUE RELEASES: Anterior cruciate ligament, posterior cruciate ligament, deep medial collateral ligament,  patellofemoral ligament   IMPLANTS UTILIZED: DePuy Attune size 4 posterior stabilized femoral component (cemented), size 4 rotating platform tibial component (cemented), 32 mm medialized dome patella (cemented), and a 7 mm stabilized rotating platform polyethylene insert.  Treatments: none  Discharge Exam: Blood pressure 109/72, pulse 92, temperature 97.8 F (36.6 C), temperature source Oral, resp. rate 17, height 5\' 2"  (1.575 m), weight 99.3 kg, SpO2 98%.   Disposition: home   Allergies as of 01/31/2024   No Known Allergies      Medication List     TAKE these medications    amoxicillin 500 MG capsule Commonly known as: AMOXIL Take 2,000 mg by mouth See admin instructions. Take 4 capsules (2000 mg) by mouth 1 hour prior to dental appointments.   aspirin 81 MG chewable tablet Chew 1 tablet (81 mg total) by mouth 2 (two) times daily. What changed: when to take this   celecoxib 200 MG capsule Commonly known as: CELEBREX Take 1 capsule (200 mg total) by mouth 2 (two) times daily.   chlorhexidine 4 % external liquid Commonly known as: HIBICLENS Apply 15 mLs (1 Application total) topically as directed for 30 doses. Use as directed daily for 5 days every other week for 6 weeks.   multivitamin tablet Take 1 tablet by mouth daily. gummies   mupirocin ointment 2 % Commonly known as: BACTROBAN Place 1 Application into the nose 2 (two) times daily for 60 doses. Use as directed 2 times daily for 5 days every other week for 6 weeks.   OSTEO BI-FLEX ONE PER DAY PO Take 1 tablet by mouth in the morning.   oxyCODONE 5 MG immediate release tablet Commonly known as: Oxy  IR/ROXICODONE Take 1 tablet (5 mg total) by mouth every 4 (four) hours as needed for moderate pain (pain score 4-6) (pain score 4-6).   traMADol 50 MG tablet Commonly known as: ULTRAM Take 1-2 tablets (50-100 mg total) by mouth every 4 (four) hours as needed for moderate pain (pain score 4-6).                Durable Medical Equipment  (From admission, onward)           Start     Ordered   01/30/24 1710  DME Walker rolling  Once       Question:  Patient needs a walker to treat with the following condition  Answer:  Total knee replacement status   01/30/24 1709   01/30/24 1710  DME Bedside commode  Once       Comments: Patient is not able to walk the distance required to go the bathroom, or he/she is unable to safely negotiate stairs required to access the bathroom.  A 3in1 BSC will alleviate this problem  Question:  Patient needs a bedside commode to treat with the following condition  Answer:  Total knee replacement status   01/30/24 1709            Follow-up Information     Rayburn Go, PA-C Follow up on 02/14/2024.   Specialty: Orthopedic Surgery Why: at 2:00pm Contact information: [address] [place] [address] [phone]         Donato Heinz, MD Follow up on 03/13/2024.   Specialty: Orthopedic Surgery Why: at 2:30pm Contact information: [address] [facility] [place] [address] [phone]                 Signed: Gean Birchwood 01/31/2024, 8:26 AM   Objective: Vital signs in last 24 hours: Temp:  [97.4 F (36.3 C)-98.5 F (36.9 C)] 97.8 F (36.6 C) (04/01 0757) Pulse Rate:  [76-97] 92 (04/01 0757) Resp:  [16-28] 17 (04/01 0757) BP: (91-116)/(61-87) 109/72 (04/01 0757) SpO2:  [93 %-100 %] 98 % (04/01 0757) Weight:  [99.3 kg] 99.3 kg (03/31 1541)  Intake/Output from previous day:  Intake/Output Summary (Last 24 hours) at 01/31/2024 0826 Last data filed at 01/31/2024 0809 Gross per 24 hour  Intake 2644.97 ml  Output 880 ml  Net 1764.97 ml    Intake/Output this shift: Total I/O In: -  Out: 70 [Drains:70]  Labs: No results for input(s): "HGB" in the last 72 hours. No results for input(s): "WBC", "RBC", "HCT", "PLT" in the last 72 hours. No results for input(s): "NA", "K", "CL", "CO2", "BUN",  "CREATININE", "GLUCOSE", "CALCIUM" in the last 72 hours. No results for input(s): "LABPT", "INR" in the last 72 hours.  EXAM: General - Patient is Alert, Appropriate, and Oriented Extremity - Neurologically intact Neurovascular intact Sensation intact distally Intact pulses distally Dorsiflexion/Plantar flexion intact No cellulitis present Compartment soft Dressing - dressing C/D/I and no drainage Motor Function - intact, moving foot and toes well on exam.  JP Drain pulled without difficulty. 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   Weight as of this encounter: 99.3 kg.  Patient will continue to work with physical therapy    Discussed with the patient continuing to utilize Polar Care   Patient will use bone foam in 20-30 minute intervals   Patient will wear TED hose bilaterally to help prevent DVT and clot formation   Discussed the Aquacel bandage.  This bandage will stay in place 7 days postoperatively.  Can be replaced with honeycomb bandages that will be sent home  with the patient   Discussed sending the patient home with tramadol and oxycodone for as needed pain management.  Patient will also be sent home with Celebrex to help with swelling and inflammation.  Patient will take an 81 mg aspirin twice daily for DVT prophylaxis   JP drain removed without difficulty, intact   Weight-Bearing as tolerated to right leg   Patient will follow-up with [facility] orthopedics in 2 weeks for staple removal and reevaluation  Diet - Regular diet Follow up - in 2 weeks Activity - WBAT Disposition - Home Condition Upon Discharge - Good DVT Prophylaxis - Aspirin and TED hose  Danise Edge, PA-C Orthopaedic Surgery 01/31/2024, 8:26 AM

## 2024-03-31 IMAGING — DX DG KNEE 1-2V PORT*L*
2 series · 2 of 2 positions shown · non-contrast
Comparison: Portable exam 9949 hours without priors for comparison

CLINICAL DATA: Post LEFT total knee arthroplasty

EXAM:
PORTABLE LEFT KNEE - 1-2 VIEW

[knee ap]
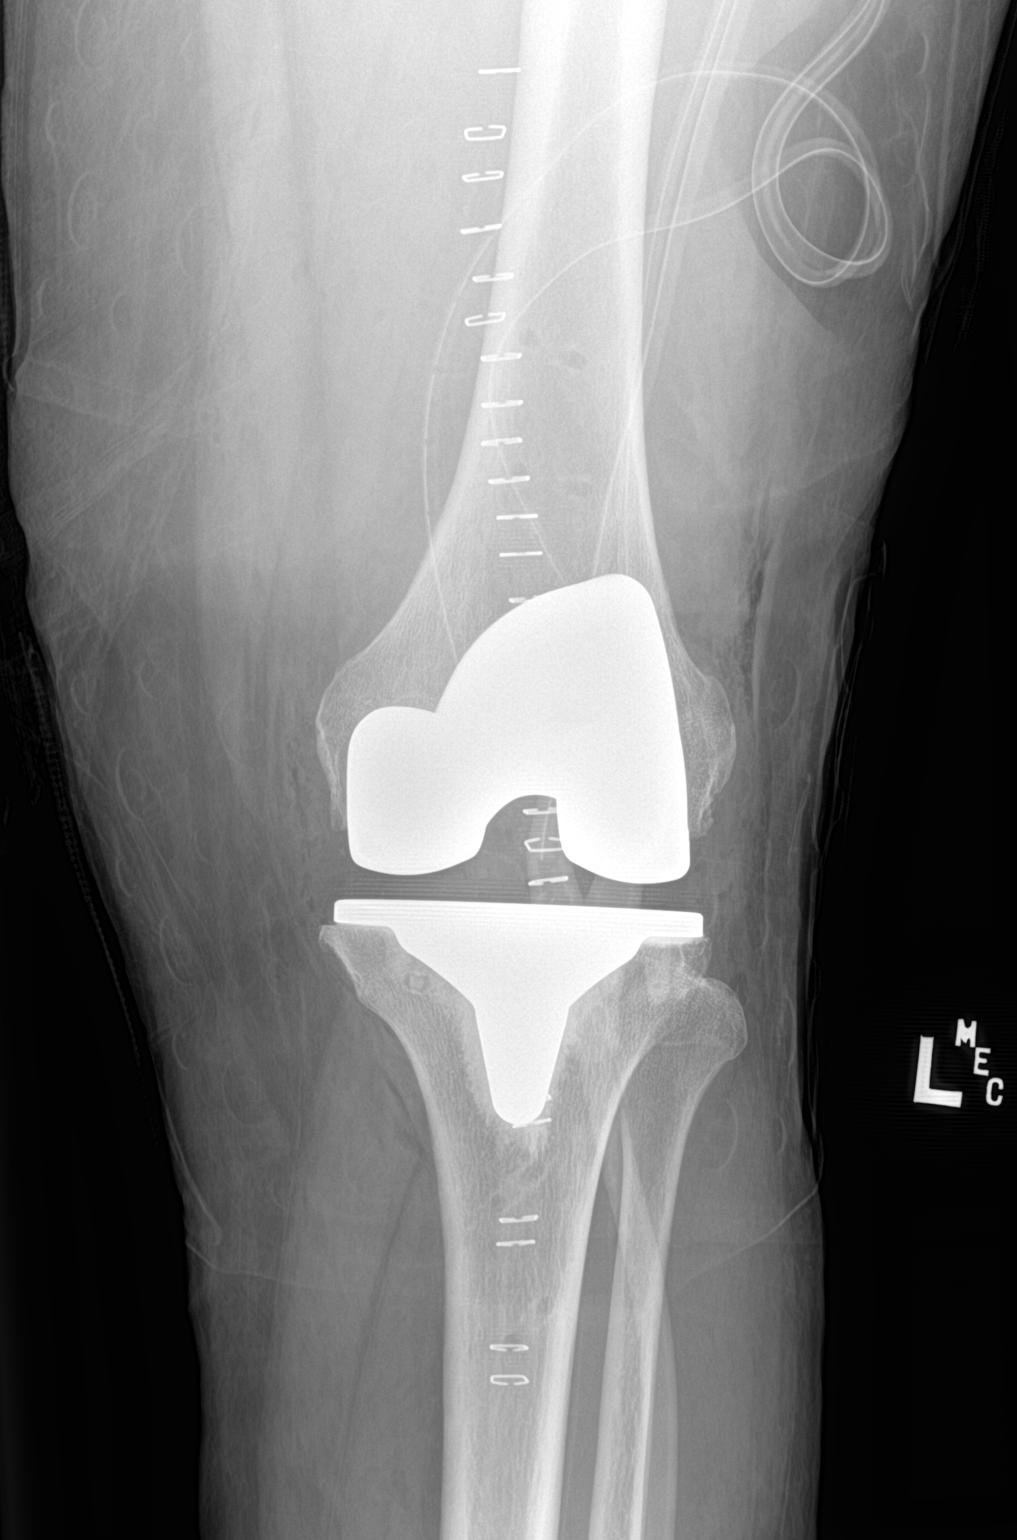

[knee lat]
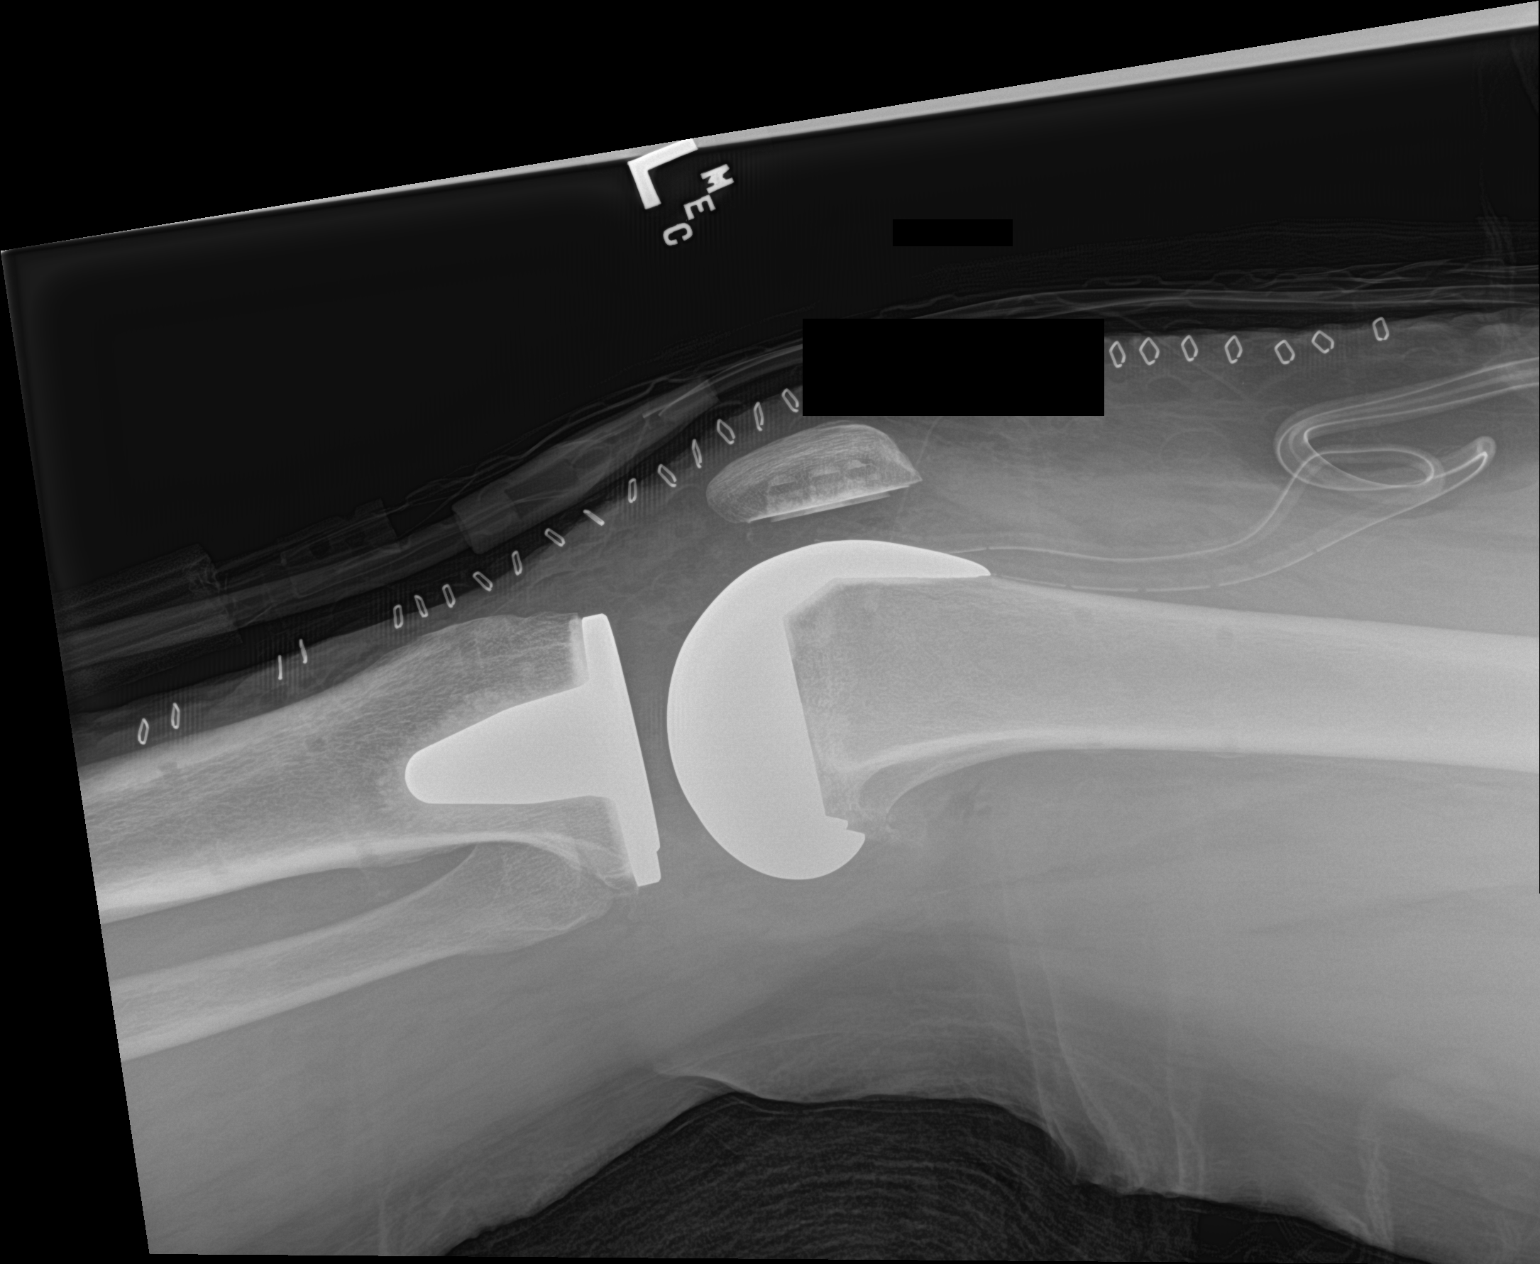

[2 of 2 positions shown; findings below may reference images not displayed]

FINDINGS: Two surgical drains present.

Osseous mineralization low normal.

Components of a LEFT knee prosthesis are identified.

No fracture, dislocation, or bone destruction.
IMPRESSION: Postsurgical changes of LEFT total knee arthroplasty.

No acute abnormalities.

## 2024-08-02 ENCOUNTER — Other Ambulatory Visit (HOSPITAL_COMMUNITY)
Admission: RE | Admit: 2024-08-02 | Discharge: 2024-08-02 | Disposition: A | Source: Ambulatory Visit | Attending: Family Medicine | Admitting: Family Medicine

## 2024-08-02 ENCOUNTER — Ambulatory Visit (INDEPENDENT_AMBULATORY_CARE_PROVIDER_SITE_OTHER): Admitting: Family Medicine

## 2024-08-02 ENCOUNTER — Encounter: Payer: Self-pay | Admitting: Family Medicine

## 2024-08-02 VITALS — BP 118/90 | HR 81 | Temp 98.4°F | Ht 62.0 in | Wt 224.0 lb

## 2024-08-02 DIAGNOSIS — Z124 Encounter for screening for malignant neoplasm of cervix: Secondary | ICD-10-CM | POA: Diagnosis present

## 2024-08-02 DIAGNOSIS — N9089 Other specified noninflammatory disorders of vulva and perineum: Secondary | ICD-10-CM

## 2024-08-02 DIAGNOSIS — Z01411 Encounter for gynecological examination (general) (routine) with abnormal findings: Secondary | ICD-10-CM

## 2024-08-02 DIAGNOSIS — Z13220 Encounter for screening for lipoid disorders: Secondary | ICD-10-CM

## 2024-08-02 DIAGNOSIS — R030 Elevated blood-pressure reading, without diagnosis of hypertension: Secondary | ICD-10-CM

## 2024-08-02 DIAGNOSIS — Z01419 Encounter for gynecological examination (general) (routine) without abnormal findings: Secondary | ICD-10-CM

## 2024-08-02 DIAGNOSIS — E66813 Obesity, class 3: Secondary | ICD-10-CM | POA: Diagnosis not present

## 2024-08-02 DIAGNOSIS — Z23 Encounter for immunization: Secondary | ICD-10-CM

## 2024-08-02 DIAGNOSIS — Z130 Encounter for screening for diseases of the blood and blood-forming organs and certain disorders involving the immune mechanism: Secondary | ICD-10-CM

## 2024-08-02 DIAGNOSIS — Z131 Encounter for screening for diabetes mellitus: Secondary | ICD-10-CM

## 2024-08-02 DIAGNOSIS — R221 Localized swelling, mass and lump, neck: Secondary | ICD-10-CM | POA: Diagnosis not present

## 2024-08-02 DIAGNOSIS — Z0000 Encounter for general adult medical examination without abnormal findings: Secondary | ICD-10-CM

## 2024-08-02 DIAGNOSIS — E559 Vitamin D deficiency, unspecified: Secondary | ICD-10-CM

## 2024-08-02 DIAGNOSIS — Z0289 Encounter for other administrative examinations: Secondary | ICD-10-CM

## 2024-08-02 MED ORDER — TRIAMCINOLONE ACETONIDE 0.1 % EX OINT: 1.0000 | TOPICAL_OINTMENT | Freq: Two times a day (BID) | CUTANEOUS | 2 refills | Status: DC

## 2024-08-02 NOTE — Progress Notes (Signed)
 Complete physical exam   Patient: Annette Bradford   DOB: 1965/12/22   58 y.o. Female  MRN: [id number] Visit Date: 08/02/2024  Today's healthcare provider: Rockie Agent, MD   Chief Complaint  Patient presents with   Annual Exam    Patient is present for annual exam. Patient avoids fried foods and sodas lots of veggies. Exercising 3 x week at the gym.  Vaccines(Flu, prevnar, Hep B): Patient will receive all today  Screenings(Pap): Patient will do this as well   Mass    Patient reports lump in right side of neck, onset x 2 weeks. Patient states this has gone down and recently had a sinus infection. Would like this examined    Subjective    Annette Bradford is a 58 y.o. female who presents today for a complete physical exam.    She does have additional problems to discuss today.   Discussed the use of AI scribe software for clinical note transcription with the patient, who gave verbal consent to proceed.  History of Present Illness Annette Bradford is a 58 year old female who presents for her annual physical exam and evaluation of a neck mass.  She has a lump on the right side of her neck for the past two weeks, which has increased in size. She recently had a sinus infection and is concerned about the mass. The mass has previously decreased in size after using lemon drop candy and turmeric for inflammation. It is not sore and does not cause difficulty swallowing.  She is not currently on any medications for hypertension and attributes a recent increase in sodium intake, such as eating pig feet, as a potential cause for the elevated reading.  She exercises three times a week, engaging in activities like biking, using an easy glider, and walking on a treadmill. She had knee surgery four to five months ago and reports improvement in her ability to walk without pain. She believes she has gained muscle mass as her clothes fit looser despite an increase in  weight. She has a history of knee surgery in 2024 and 2025 and reports no current knee pain.  She experiences occasional breast pain, which she describes as transient and not associated with any palpable lumps. She performs regular self-breast exams and has had normal mammograms.  She reports itching on the labia, which she manages with gentle soap and Blue Star ointment. No internal itching.     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;  Service: Endoscopy;  Laterality: N/A;   COLONOSCOPY WITH PROPOFOL  N/A 09/10/2022   Procedure: COLONOSCOPY WITH PROPOFOL ;  Surgeon: Copping Rogelia, MD;  Location: [facility];  Service: Endoscopy;  Laterality: N/A;   KNEE ARTHROPLASTY Left 02/10/2022   Procedure: COMPUTER ASSISTED  TOTAL KNEE ARTHROPLASTY;  Surgeon: Mardee Lynwood SQUIBB, MD;  Location: [facility];  Service: Orthopedics;  Laterality: Left;   KNEE ARTHROPLASTY Right 01/30/2024   Procedure: ARTHROPLASTY, KNEE, TOTAL, USING IMAGELESS COMPUTER-ASSISTED NAVIGATION;  Surgeon: Mardee Lynwood SQUIBB, MD;  Location: [facility];  Service: Orthopedics;  Laterality: Right;   POLYPECTOMY  12/26/2015   Procedure: POLYPECTOMY;  Surgeon: Rogelia Copping, MD;  Location: [facility];  Service: Endoscopy;;   Social History   Socioeconomic  History   Marital status: Married    Spouse name: Geophysicist/field seismologist (Spouse)   Number of children: Not on file   Years of education: Not on file   Highest education level: Not on file  Occupational History   Not on file  Tobacco Use   Smoking status: Former    Current packs/day: 0.00    Average packs/day: 0.3 packs/day for 3.0 years (0.8 ttl pk-yrs)    Types: Cigarettes    Start date: 109    Quit date: 83    Years since quitting: 35.7   Smokeless tobacco: Never  Vaping Use   Vaping status: Never Used  Substance and Sexual Activity   Alcohol  use: Yes    Alcohol /week: 0.0 standard drinks of alcohol     Comment: OCCASIONALLY 1-2 TIMES A MONTH   Drug use: No   Sexual activity: Yes  Other Topics Concern   Not on file  Social History Narrative   Not on file   Social Drivers of Health   Financial Resource Strain: Low Risk  (11/29/2023)   Received from [facility]   Overall Financial Resource Strain (CARDIA)    Difficulty of Paying Living Expenses: Not hard at all  Food Insecurity: No Food Insecurity (01/30/2024)   Hunger Vital Sign    Worried About Running Out of Food in the Last Year: Never true    Ran Out of Food in the Last Year: Never true  Transportation Needs: No Transportation Needs (01/30/2024)   PRAPARE - Administrator, Civil Service (Medical): No    Lack of Transportation (Non-Medical): No  Physical Activity: Not on file  Stress: Not on file  Social Connections: Not on file  Intimate Partner Violence: Not At Risk (01/30/2024)   Humiliation, Afraid, Rape, and Kick questionnaire    Fear of Current or Ex-Partner: No    Emotionally Abused: No    Physically Abused: No    Sexually Abused: No   Family Status  Relation Name Status   Mother  Alive   Father  Deceased at age 107   Sister  Alive   Brother  Alive   Son  Alive   Sister  Alive   Brother  Alive  No partnership data on file   Family History  Problem Relation Age of Onset    Diabetes Mother    Heart attack Father    Diabetes Sister    Hypertension Brother    Cancer Sister    No Known Allergies   Medications: Outpatient Medications Prior to Visit  Medication Sig   amoxicillin (AMOXIL) 500 MG capsule Take 2,000 mg by mouth See admin instructions. Take 4 capsules (2000 mg) by mouth 1 hour prior to dental appointments.   aspirin  81 MG chewable tablet Chew 1 tablet (81 mg total) by mouth 2 (two) times daily.   Multiple Vitamin (MULTIVITAMIN) tablet Take 1 tablet by mouth daily. gummies   [DISCONTINUED] Boswellia-Glucosamine-Vit D (OSTEO BI-FLEX ONE PER DAY PO) Take 1 tablet by mouth in the  morning.   [DISCONTINUED] celecoxib  (CELEBREX ) 200 MG capsule Take 1 capsule (200 mg total) by mouth 2 (two) times daily.   [DISCONTINUED] chlorhexidine  (HIBICLENS ) 4 % external liquid Apply 15 mLs (1 Application total) topically as directed for 30 doses. Use as directed daily for 5 days every other week for 6 weeks.   [DISCONTINUED] oxyCODONE  (OXY IR/ROXICODONE ) 5 MG immediate release tablet Take 1 tablet (5 mg total) by mouth every 4 (four) hours as needed for moderate pain (pain score 4-6) (pain score 4-6).   [DISCONTINUED] traMADol  (ULTRAM ) 50 MG tablet Take 1-2 tablets (50-100 mg total) by mouth every 4 (four) hours as needed for moderate pain (pain score 4-6).   No facility-administered medications prior to visit.    Review of Systems  Last CBC Lab Results  Component Value Date   WBC 6.1 01/19/2024   HGB 13.6 01/19/2024   HCT 39.9 01/19/2024   MCV 97.1 01/19/2024   MCH 33.1 01/19/2024   RDW 13.5 01/19/2024   PLT 242 01/19/2024   Last metabolic panel Lab Results  Component Value Date   GLUCOSE 81 01/19/2024   NA 138 01/19/2024   K 3.7 01/19/2024   CL 106 01/19/2024   CO2 27 01/19/2024   BUN 25 (H) 01/19/2024   CREATININE 0.71 01/19/2024   GFRNONAA >60 01/19/2024   CALCIUM 9.1 01/19/2024   PROT 7.5 01/19/2024   ALBUMIN 4.0 01/19/2024   LABGLOB 3.2  08/02/2023   AGRATIO 1.7 07/29/2022   BILITOT 0.6 01/19/2024   ALKPHOS 62 01/19/2024   AST 34 01/19/2024   ALT 37 01/19/2024   ANIONGAP 5 01/19/2024   Last lipids Lab Results  Component Value Date   CHOL 239 (H) 08/02/2023   HDL 64 08/02/2023   LDLCALC 150 (H) 08/02/2023   TRIG 142 08/02/2023   CHOLHDL 3.7 08/02/2023   Last hemoglobin A1c Lab Results  Component Value Date   HGBA1C 5.7 (H) 08/02/2023   Last thyroid functions Lab Results  Component Value Date   TSH 0.748 08/02/2023   Last vitamin D  Lab Results  Component Value Date   VD25OH 64.9 08/02/2023   Last vitamin B12 and Folate No results found for: VITAMINB12, FOLATE     Objective    BP (!) 118/90 (BP Location: Right Arm, Patient Position: Sitting, Cuff Size: Normal)   Pulse 81   Temp 98.4 F (36.9 C) (Oral)   Ht 5' 2 (1.575 m)   Wt 224 lb (101.6 kg)   SpO2 97%   BMI 40.97 kg/m   BP Readings from Last 3 Encounters:  08/02/24 (!) 118/90  01/31/24 109/72  01/19/24 124/82   Wt Readings from Last 3 Encounters:  08/02/24 224 lb (101.6 kg)  01/30/24 218 lb 14.7 oz (99.3 kg)  01/19/24 218 lb 14.7 oz (99.3 kg)        Physical Exam Vitals reviewed. Exam conducted with a chaperone present.  Constitutional:      General: She is not in acute distress.    Appearance: Normal appearance. She is not ill-appearing, toxic-appearing or diaphoretic.  HENT:     Head: Normocephalic and atraumatic.     Right Ear: Tympanic membrane and external ear normal. There is no impacted cerumen.     Left Ear: Tympanic membrane and external ear normal. There is no impacted cerumen.     Nose: Nose normal.     Mouth/Throat:     Pharynx: Oropharynx is clear.  Eyes:     General: No scleral icterus.  Extraocular Movements: Extraocular movements intact.     Conjunctiva/sclera: Conjunctivae normal.     Pupils: Pupils are equal, round, and reactive to light.  Neck:   Cardiovascular:     Rate and Rhythm: Normal  rate and regular rhythm.     Pulses: Normal pulses.     Heart sounds: Normal heart sounds. No murmur heard.    No friction rub. No gallop.  Pulmonary:     Effort: Pulmonary effort is normal. No respiratory distress.     Breath sounds: Normal breath sounds. No wheezing, rhonchi or rales.  Abdominal:     General: Bowel sounds are normal. There is no distension.     Palpations: Abdomen is soft. There is no mass.     Tenderness: There is no abdominal tenderness. There is no guarding.  Genitourinary:    Pubic Area: No rash or pubic lice.      Labia:        Right: No rash.        Left: No rash.      Vagina: No vaginal discharge, erythema or lesions.     Cervix: Normal. No discharge, lesion or erythema.     Comments: Hypopigmentation noted on bilateral labial  Posterior labia mildly macerated  Musculoskeletal:        General: No deformity.     Cervical back: Normal range of motion and neck supple.     Right lower leg: No edema.     Left lower leg: No edema.  Lymphadenopathy:     Cervical: No cervical adenopathy.  Skin:    General: Skin is warm.     Capillary Refill: Capillary refill takes less than 2 seconds.     Findings: No erythema or rash.  Neurological:     General: No focal deficit present.     Mental Status: She is alert and oriented to person, place, and time.     Cranial Nerves: Cranial nerves 2-12 are intact. No cranial nerve deficit or facial asymmetry.     Motor: Motor function is intact. No weakness.     Gait: Gait normal.  Psychiatric:        Mood and Affect: Mood normal.        Behavior: Behavior normal.       Last depression screening scores    08/02/2024    9:08 AM 08/02/2023    9:01 AM 01/11/2023    8:52 AM  PHQ 2/9 Scores  PHQ - 2 Score 0 0 0  PHQ- 9 Score 0 0 0    Last fall risk screening    08/02/2024    9:08 AM  Fall Risk   Falls in the past year? 0  Number falls in past yr: 0  Injury with Fall? 0  Risk for fall due to : No Fall Risks  Follow  up Falls evaluation completed    Last Audit-C alcohol  use screening    01/11/2023    8:52 AM  Alcohol  Use Disorder Test (AUDIT)  1. How often do you have a drink containing alcohol ? 0  2. How many drinks containing alcohol  do you have on a typical day when you are drinking? 0  3. How often do you have six or more drinks on one occasion? 0  AUDIT-C Score 0   A score of 3 or more in women, and 4 or more in men indicates increased risk for alcohol  abuse, EXCEPT if all of the points are from question 1   No  results found for any visits on 08/02/24.  Assessment & Plan    Routine Health Maintenance and Physical Exam  Immunization History  Administered Date(s) Administered   Hepb-cpg 08/02/2024   Influenza, Seasonal, Injecte, Preservative Fre 08/02/2023, 08/02/2024   Influenza,inj,Quad PF,6+ Mos 07/29/2022   Influenza-Unspecified 09/20/2017   Moderna Covid-19 Vaccine Bivalent Booster 18yrs & up 08/12/2021   Moderna Sars-Covid-2 Vaccination 11/12/2019, 12/10/2019, 09/12/2020   PNEUMOCOCCAL CONJUGATE-20 08/02/2024   Td 07/07/1994, 06/27/2006, 11/22/2017   Tdap 06/27/2006   Zoster Recombinant(Shingrix ) 07/29/2022, 10/06/2022    Health Maintenance  Topic Date Due   Cervical Cancer Screening (HPV/Pap Cotest)  04/04/2024   COVID-19 Vaccine (5 - 2025-26 season) 08/18/2024 (Originally 07/02/2024)   Hepatitis B Vaccines 19-59 Average Risk (2 of 2 - CpG 2-dose series) 08/30/2024   Mammogram  12/18/2024   DTaP/Tdap/Td (5 - Td or Tdap) 11/23/2027   Colonoscopy  09/10/2032   Pneumococcal Vaccine: 50+ Years  Completed   Influenza Vaccine  Completed   Hepatitis C Screening  Completed   HIV Screening  Completed   Zoster Vaccines- Shingrix   Completed   HPV VACCINES  Aged Out   Meningococcal B Vaccine  Aged Out    Problem List Items Addressed This Visit     Annual physical exam - Primary   Avitaminosis D   Relevant Orders   VITAMIN D  25 Hydroxy (Vit-D Deficiency, Fractures)   Other  Visit Diagnoses       Well woman exam with routine gynecological exam         Neck mass       Relevant Orders   CBC   TSH + free T4   US  Soft Tissue Head/Neck (NON-THYROID)     Immunization due       Relevant Orders   Flu vaccine trivalent PF, 6mos and older(Flulaval,Afluria,Fluarix,Fluzone) (Completed)   Pneumococcal conjugate vaccine 20-valent (Prevnar 20) (Completed)   Heplisav-B (HepB-CPG) Vaccine (Completed)     Pap smear for cervical cancer screening       Relevant Orders   Cytology - PAP     Elevated blood pressure reading         Screening for diabetes mellitus       Relevant Orders   Hemoglobin A1c     Screening for deficiency anemia         Screening for lipid disorders       Relevant Orders   Lipid panel     Encounter for review of form with patient         Obesity, Class III, BMI 40-49.9 (morbid obesity) (HCC)       Relevant Orders   CMP14+EGFR   Lipid panel   TSH + free T4     Labial irritation       Relevant Medications   triamcinolone  ointment (KENALOG ) 0.1 %       Assessment and Plan Assessment & Plan Right neck mass, likely salivary gland or lymph node swelling Mass on the right side of the neck present for two weeks, increased in size, possibly related to recent sinus infection. Likely an enlarged salivary gland or lymph node. Mass is firm but decreasing in size, which is reassuring. Differential includes inflamed salivary gland or lymph node swelling due to infection. - Order neck ultrasound - Consider ENT referral if ultrasound findings are concerning - Order CBC to assess white blood cell count  Elevated blood pressure, not on antihypertensives Blood pressure recorded at 118/90. Elevated diastolic pressure. No current antihypertensive medication.  Possible dietary influence from recent high-sodium meal. - Repeat blood pressure measurement after completing other assessments  Obesity BMI is 40. Reports regular exercise and dietary habits. Recent  knee surgery has improved mobility, allowing for increased physical activity.  Labial irritation and pruritus Intermittent itching on the labia, not inside. Skin changes noted, possibly due to scratching. No current infection noted. - Prescribe topical steroid cream for labial irritation, to be used for one to two weeks  Adult Wellness Visit Annual physical examination conducted. Discussed diet and exercise habits. Exercises three times a week and consumes a diet with fried foods and vegetables. Completed work form confirming physical today. - Administered flu, Hep B dose 1, and pneumococcal vaccines  - Perform Pap smear - Order CBC, CRP, A1c, metabolic panel, vitamin D  levels, cholesterol panel, and thyroid function tests - Screen for diabetes       Return in about 3 months (around 11/02/2024) for labial irritation, BP, neck mass .       Rockie Agent, MD  [facility] [phone] (phone) [phone] (fax)  [facility]

## 2024-08-02 NOTE — Patient Instructions (Addendum)
 It was a pleasure to see you today!  Thank you for choosing [facility] for your primary care.   Today you were seen for your annual physical  Please review the attached information regarding helpful preventive health topics.   To keep you healthy, please keep in mind the following health maintenance items that you are due for:   Health Maintenance Due  Topic Date Due   Hepatitis B Vaccines 19-59 Average Risk (1 of 3 - 19+ 3-dose series) Never done   Pneumococcal Vaccine: 50+ Years (1 of 1 - PCV) Never done   Cervical Cancer Screening (HPV/Pap Cotest)  04/04/2024   Influenza Vaccine  06/01/2024     Best Wishes,   Dr. Lang

## 2024-08-03 LAB — CMP14+EGFR
ALT: 58 IU/L — ABNORMAL HIGH (ref 0–32)
AST: 53 IU/L — ABNORMAL HIGH (ref 0–40)
Albumin: 4.6 g/dL (ref 3.8–4.9)
Alkaline Phosphatase: 85 IU/L (ref 49–135)
BUN/Creatinine Ratio: 31 — ABNORMAL HIGH (ref 9–23)
BUN: 20 mg/dL (ref 6–24)
Bilirubin Total: 0.2 mg/dL (ref 0.0–1.2)
CO2: 23 mmol/L (ref 20–29)
Calcium: 9.8 mg/dL (ref 8.7–10.2)
Chloride: 100 mmol/L (ref 96–106)
Creatinine, Ser: 0.64 mg/dL (ref 0.57–1.00)
Globulin, Total: 2.7 g/dL (ref 1.5–4.5)
Glucose: 84 mg/dL (ref 70–99)
Potassium: 4.2 mmol/L (ref 3.5–5.2)
Sodium: 138 mmol/L (ref 134–144)
Total Protein: 7.3 g/dL (ref 6.0–8.5)
eGFR: 102 mL/min/1.73 (ref >59)

## 2024-08-03 LAB — LIPID PANEL
Chol/HDL Ratio: 3.8 ratio (ref 0.0–4.4)
Cholesterol, Total: 236 mg/dL — ABNORMAL HIGH (ref 100–199)
HDL: 62 mg/dL (ref >39)
LDL Chol Calc (NIH): 145 mg/dL — ABNORMAL HIGH (ref 0–99)
Triglycerides: 165 mg/dL — ABNORMAL HIGH (ref 0–149)
VLDL Cholesterol Cal: 29 mg/dL (ref 5–40)

## 2024-08-03 LAB — TSH+FREE T4
Free T4: 1.01 ng/dL (ref 0.82–1.77)
TSH: 0.691 u[IU]/mL (ref 0.450–4.500)

## 2024-08-03 LAB — CBC
Hematocrit: 42.4 % (ref 34.0–46.6)
Hemoglobin: 14.3 g/dL (ref 11.1–15.9)
MCH: 33.3 pg — ABNORMAL HIGH (ref 26.6–33.0)
MCHC: 33.7 g/dL (ref 31.5–35.7)
MCV: 99 fL — ABNORMAL HIGH (ref 79–97)
Platelets: 226 x10E3/uL (ref 150–450)
RBC: 4.29 x10E6/uL (ref 3.77–5.28)
RDW: 14.4 % (ref 11.7–15.4)
WBC: 5.9 x10E3/uL (ref 3.4–10.8)

## 2024-08-03 LAB — HEMOGLOBIN A1C
Est. average glucose Bld gHb Est-mCnc: 123 mg/dL
Hgb A1c MFr Bld: 5.9 % — ABNORMAL HIGH (ref 4.8–5.6)

## 2024-08-03 LAB — VITAMIN D 25 HYDROXY (VIT D DEFICIENCY, FRACTURES): Vit D, 25-Hydroxy: 44.9 ng/mL (ref 30.0–100.0)

## 2024-08-06 ENCOUNTER — Ambulatory Visit
Admission: RE | Admit: 2024-08-06 | Discharge: 2024-08-06 | Disposition: A | Discharge status: Home / Self Care | Source: Ambulatory Visit | Attending: Family Medicine | Admitting: Family Medicine

## 2024-08-06 DIAGNOSIS — R221 Localized swelling, mass and lump, neck: Secondary | ICD-10-CM | POA: Diagnosis present

## 2024-08-06 LAB — CYTOLOGY - PAP
Adequacy: ABSENT
Comment: NEGATIVE
Diagnosis: NEGATIVE
High risk HPV: NEGATIVE

## 2024-08-10 ENCOUNTER — Ambulatory Visit: Payer: Self-pay | Admitting: Family Medicine

## 2024-08-10 DIAGNOSIS — K1121 Acute sialoadenitis: Secondary | ICD-10-CM

## 2024-09-02 MED ORDER — AMOXICILLIN-POT CLAVULANATE 875-125 MG PO TABS: 1.0000 | ORAL_TABLET | Freq: Two times a day (BID) | ORAL | 0 refills | Status: AC

## 2024-09-06 ENCOUNTER — Ambulatory Visit

## 2024-09-06 DIAGNOSIS — Z23 Encounter for immunization: Secondary | ICD-10-CM | POA: Diagnosis not present

## 2024-09-06 NOTE — Progress Notes (Unsigned)
 Patient is present for 2nd dose of Hepatitis B vaccination. Injection was given in Right deltoid and patient tolerated it well.

## 2024-09-06 NOTE — Progress Notes (Unsigned)
      Established patient visit   Patient: Annette Bradford   DOB: 15-Feb-1966   58 y.o. Female  MRN: [id number] Visit Date: 09/06/2024  Today's healthcare provider: Rockie Agent, MD   No chief complaint on file.  Subjective       Discussed the use of AI scribe software for clinical note transcription with the patient, who gave verbal consent to proceed.  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 Take 4 capsules (2000 mg) by mouth 1 hour prior to dental appointments.   amoxicillin-clavulanate (AUGMENTIN) 875-125 MG tablet Take 1 tablet by mouth 2 (two) times daily for 7 days.   aspirin  81 MG chewable tablet Chew 1 tablet (81 mg total) by mouth 2 (two) times daily.   Multiple Vitamin (MULTIVITAMIN) tablet Take 1 tablet by mouth daily. gummies   triamcinolone  ointment (KENALOG ) 0.1 % Apply 1 Application topically 2 (two) times daily.   No facility-administered medications prior to visit.    Review of Systems  {Insert previous labs (optional):23779} {See past labs  Heme  Chem  Endocrine  Serology  Results Review (optional):1}   Objective    There were no vitals taken for this visit. {Insert last BP/Wt (optional):23777}{See vitals history (optional):1}    Physical Exam  ***  No  results found for any visits on 09/06/24.  Assessment & Plan     Problem List Items Addressed This Visit   None   Assessment and Plan Assessment & Plan      No follow-ups on file.         Rockie Agent, MD  [facility] [phone] (phone) [phone] (fax)  [facility]

## 2024-11-07 ENCOUNTER — Encounter: Payer: Self-pay | Admitting: Family Medicine

## 2024-11-07 ENCOUNTER — Ambulatory Visit: Admitting: Family Medicine

## 2024-11-07 VITALS — BP 114/84 | HR 92 | Temp 98.4°F | Ht 62.0 in | Wt 224.4 lb

## 2024-11-07 DIAGNOSIS — H938X2 Other specified disorders of left ear: Secondary | ICD-10-CM | POA: Diagnosis not present

## 2024-11-07 DIAGNOSIS — R030 Elevated blood-pressure reading, without diagnosis of hypertension: Secondary | ICD-10-CM | POA: Diagnosis not present

## 2024-11-07 DIAGNOSIS — N9089 Other specified noninflammatory disorders of vulva and perineum: Secondary | ICD-10-CM

## 2024-11-07 DIAGNOSIS — K1121 Acute sialoadenitis: Secondary | ICD-10-CM | POA: Diagnosis not present

## 2024-11-07 DIAGNOSIS — Z1231 Encounter for screening mammogram for malignant neoplasm of breast: Secondary | ICD-10-CM | POA: Diagnosis not present

## 2024-11-07 DIAGNOSIS — B9689 Other specified bacterial agents as the cause of diseases classified elsewhere: Secondary | ICD-10-CM

## 2024-11-07 MED ORDER — TRIAMCINOLONE ACETONIDE 0.1 % EX OINT: 1.0000 | TOPICAL_OINTMENT | Freq: Two times a day (BID) | CUTANEOUS | 4 refills | Status: AC

## 2024-11-07 NOTE — Patient Instructions (Signed)
 To keep you healthy, please keep in mind the following health maintenance items that you are due for:   Health Maintenance Due  Topic Date Due   COVID-19 Vaccine (5 - 2025-26 season) 07/02/2024   Mammogram  12/18/2024     Best Wishes,   Dr. Lang

## 2024-11-07 NOTE — Progress Notes (Signed)
 "  Established Patient Office Visit  Patient ID: Annette Bradford, female    DOB: Nov 05, 1965  Age: 59 y.o. MRN: [id number] PCP: Sharma Coyer, MD  Chief Complaint  Patient presents with   Medical Management of Chronic Issues    Patient reports doing well, today complains of left ear fullness. Saw telehealth provider and started taking allergy medication, did help but still has some fullness x one week     Subjective:     HPI  Discussed the use of AI scribe software for clinical note transcription with the patient, who gave verbal consent to proceed.  History of Present Illness Annette Bradford is a 59 year old female who presents for follow-up on labial irritation and left ear fullness.  She is following up on labial irritation that has been treated with triamcinolone . The treatment has been effective, and she uses it as needed when itching occurs. It has worked better than previous treatments.  She reports left ear fullness and was started on Claritin D after a telehealth consultation. She began the medication on Sunday and has three more pills left. The medication has been helping, but she experienced ear fullness again upon waking last night. She uses wax removal drops and a suction device, which have helped her hearing. She also reports blowing her nose last week and noticing some blood, which she associates with a possible sinus infection. She has Flonase nasal spray and uses it to help with congestion.  She was recently treated for acute bacterial sialadenitis with Augmentin , which resolved the infection. She completed the prescribed course and noted improvement.  Her blood pressure was elevated at a previous visit. She has a history of chronic vitamin D  deficiency but is not currently taking supplements.  She works third shift at [employer] and uses [employer] for medical consultations. She plans to visit her son in [place], [place] , soon.   Patient  Active Problem List   Diagnosis Date Noted   History of total knee arthroplasty, right 01/30/2024   History of colonic polyps 09/10/2022   Status post total left knee replacement 02/10/2022   Primary osteoarthritis of right knee 12/27/2021   Annual physical exam 07/28/2021   Avitaminosis D 04/16/2015      ROS    Objective:     BP 114/84 (BP Location: Left Arm, Patient Position: Sitting, Cuff Size: Normal)   Pulse 92   Temp 98.4 F (36.9 C) (Oral)   Ht 5' 2 (1.575 m)   Wt 224 lb 6.4 oz (101.8 kg)   SpO2 99%   BMI 41.04 kg/m  BP Readings from Last 3 Encounters:  11/07/24 114/84  08/02/24 (!) 118/90  01/31/24 109/72   Wt Readings from Last 3 Encounters:  11/07/24 224 lb 6.4 oz (101.8 kg)  08/02/24 224 lb (101.6 kg)  01/30/24 218 lb 14.7 oz (99.3 kg)      Physical Exam  Physical Exam VITALS: BP- 114/84 HEENT: No maxillary or frontal sinus tenderness. Turbinates not edematous. Cerumen impaction in right ear canal. Left tympanic membrane effusion without erythema or bulging.   No results found for any visits on 11/07/24.  Last metabolic panel Lab Results  Component Value Date   GLUCOSE 84 08/02/2024   NA 138 08/02/2024   K 4.2 08/02/2024   CL 100 08/02/2024   CO2 23 08/02/2024   BUN 20 08/02/2024   CREATININE 0.64 08/02/2024   EGFR 102 08/02/2024   CALCIUM 9.8 08/02/2024   PROT 7.3 08/02/2024  ALBUMIN 4.6 08/02/2024   LABGLOB 2.7 08/02/2024   AGRATIO 1.7 07/29/2022   BILITOT 0.2 08/02/2024   ALKPHOS 85 08/02/2024   AST 53 (H) 08/02/2024   ALT 58 (H) 08/02/2024   ANIONGAP 5 01/19/2024   Last lipids Lab Results  Component Value Date   CHOL 236 (H) 08/02/2024   HDL 62 08/02/2024   LDLCALC 145 (H) 08/02/2024   TRIG 165 (H) 08/02/2024   CHOLHDL 3.8 08/02/2024   Last hemoglobin A1c Lab Results  Component Value Date   HGBA1C 5.9 (H) 08/02/2024      The 10-year ASCVD risk score (Arnett DK, et al., 2019) is: 3.6%    Assessment & Plan:    Problem List Items Addressed This Visit   None Visit Diagnoses       Ear fullness, left    -  Primary     Encounter for screening mammogram for malignant neoplasm of breast       Relevant Orders   MM 3D SCREENING MAMMOGRAM BILATERAL BREAST     Acute bacterial sialadenitis         Elevated blood pressure reading         Labial irritation       Relevant Medications   triamcinolone  ointment (KENALOG ) 0.1 %       Assessment and Plan Assessment & Plan Labial irritation,improved  Chronic labial irritation effectively managed with triamcinolone . - Continue triamcinolone  as needed for labial irritation.  Left ear effusion and cerumen impaction,acute on chronic  Left ear effusion with cerumen impaction. No signs of infection. Claritin D provides symptomatic relief. Flonase recommended for inflammation reduction. Consider Benadryl  at night if symptoms persist after Claritin D. ENT referral if symptoms persist after six weeks. - Continue Claritin D for symptomatic relief. - Use Flonase nasal spray consistently to reduce inflammation. - Consider Benadryl  OTCat night if symptoms persist after Claritin D. - If symptoms persist after six weeks, will consider referral to ENT for further evaluation and possible manual removal of cerumen.  Acute bacterial sialadenitis,resolved Previously treated with Augmentin , resolved without recurrence. - Continue Augmentin  as needed for spotty treatment of sialadenitis.  Elevated blood pressure reading,resolved Previous elevated blood pressure reading normalized to 114/84.  Vitamin D  deficiency Chronic vitamin D  deficiency. - continue supplementing  General Health Maintenance Mammogram scheduled for February for breast cancer screening. - Ensure mammogram is completed in February.    Return in about 9 months (around 08/03/2025).    Rockie Agent, MD [facility]   "

## 2025-08-07 ENCOUNTER — Ambulatory Visit: Admitting: Family Medicine
